# Patient Record
Sex: Female | Born: 1970 | Race: Asian | Hispanic: No | Marital: Married | Smoking: Never smoker
Health system: Southern US, Community
[De-identification: ages and names within clinical notes are randomized; demographics above are authoritative.]

## PROBLEM LIST (undated history)

## (undated) DIAGNOSIS — D649 Anemia, unspecified: Secondary | ICD-10-CM

## (undated) DIAGNOSIS — N841 Polyp of cervix uteri: Secondary | ICD-10-CM

## (undated) HISTORY — DX: Polyp of cervix uteri: N84.1

## (undated) HISTORY — DX: Anemia, unspecified: D64.9

## (undated) HISTORY — PX: CERVICAL POLYPECTOMY: SHX88

---

## 2007-12-01 ENCOUNTER — Ambulatory Visit (HOSPITAL_COMMUNITY): Admission: RE | Admit: 2007-12-01 | Discharge: 2007-12-01 | Payer: Self-pay | Admitting: Obstetrics and Gynecology

## 2007-12-29 ENCOUNTER — Ambulatory Visit (HOSPITAL_COMMUNITY): Admission: RE | Admit: 2007-12-29 | Discharge: 2007-12-29 | Payer: Self-pay | Admitting: Obstetrics and Gynecology

## 2008-01-26 ENCOUNTER — Ambulatory Visit (HOSPITAL_COMMUNITY): Admission: RE | Admit: 2008-01-26 | Discharge: 2008-01-26 | Payer: Self-pay | Admitting: Obstetrics and Gynecology

## 2008-02-09 ENCOUNTER — Inpatient Hospital Stay (HOSPITAL_COMMUNITY): Admission: RE | Admit: 2008-02-09 | Discharge: 2008-02-12 | Payer: Self-pay | Admitting: Obstetrics and Gynecology

## 2008-02-09 ENCOUNTER — Encounter (INDEPENDENT_AMBULATORY_CARE_PROVIDER_SITE_OTHER): Payer: Self-pay | Admitting: Obstetrics and Gynecology

## 2010-08-13 NOTE — Op Note (Signed)
NAMESANDE, PICKERT NO.:  [id number]   MEDICAL RECORD NO.:  [id number]          PATIENT TYPE:  INP   LOCATION:  9302                          FACILITY:  [facility]   PHYSICIAN:  Huel Cote, M.D. DATE OF BIRTH:  May 11, 1970   DATE OF PROCEDURE:  02/09/2008  DATE OF DISCHARGE:                               OPERATIVE REPORT   PREOPERATIVE DIAGNOSES:  1. Term pregnancy at 39 plus weeks.  2. Declines trial of labor.  3. Desires permanent sterilization.  4. Probable fetal gastrointestinal anomaly.   POSTOPERATIVE DIAGNOSES:  1. Term pregnancy at 39 plus weeks.  2. Declines trial of labor.  3. Desires permanent sterilization.  4. Probable fetal gastrointestinal anomaly.  5. Breech presentation.   PROCEDURE:  Repeat low transverse cesarean section with bilateral tubal  ligation.   SURGEON:  Huel Cote, MD   ASSISTANT:  Zenaida Niece, MD   ANESTHESIA:  Spinal.   FINDINGS:  The baby was in the breech presentation, Apgars were 9 at 9,  weight was 2903 g of a viable female infant.   SPECIMEN:  Placenta and bilateral tubal segments were sent to Pathology.   ESTIMATED BLOOD LOSS:  700 mL.   URINE OUTPUT:  200 mL of clear urine.   IV FLUIDS:  2100 mL LR.   COMPLICATIONS:  None.   PROCEDURE:  The patient was taken to the operating room where spinal  anesthesia was obtained without difficulty.  She was then prepped and  draped in the normal sterile fashion in the dorsal supine position with  a leftward tilt.  A Pfannenstiel skin incision was then made with a  scalpel and carried through to the underlying layer of fascia by sharp  dissection, and the fascia nicked in the midline with Bovie.  The  fascial incision was then extended bilaterally with the Mayo scissors  and the inferior aspect grasped with Kocher clamps, elevated, and  dissected off the rectus muscles.  In a similar fashion, the superior  aspect was dissected off the rectus muscles.  The  rectus muscles were  then separated in the midline and the cavity entered bluntly.  The  peritoneal incision was then extended both superiorly and inferiorly  with careful attention to avoid both bowel and bladder.  The Alexis self-  retaining wound retractor was then placed within the incision and the  lower uterine segment exposed nicely.  The lower uterine segment was  then incised in a transverse fashion and the cavity itself entered  bluntly.  The infant was breech presenting and was delivered without  complication with arms and legs reduced easily and the head delivered in  a flexed position.  Nose and mouth were bulb suctioned and the infant  was handing off to the waiting pediatricians for evaluation.  After, the  cord was clamped and cut.  Cord blood was obtained and the placenta was  then delivered spontaneously from the uterus.  The uterus was cleared of  all clots and debris with moist lap sponge.  The uterine incision was  then repaired in a running locked layer of  1-0 chromic.  Good hemostasis  was noted.  Therefore, attention was turned to the patient's fallopian  tubes.  The fallopian tubes were grasped bilaterally with Babcock clamp.  The mesosalpinx had a small window made with the Bovie cautery and this  was then passed through with free tie of 0 plain.  These were tied off  x2 on each side.  A 2-3 cm segment of tube and was completely amputated  without difficulty.  The free pedicles were additionally cauterized with  Bovie cautery.  Good hemostasis was noted.  This was performed  bilaterally with approximately 2-cm segment of tube removed from either  side.  All appeared hemostatic.  The gutters were cleared of all clots  and debris with moist lap sponge.  The remainder of the uterine incision  was once again inspected and any small areas of oozing treated with  Bovie cautery.  The bladder flap was inspected and found to be  hemostatic.  Therefore, the rectus muscles  and peritoneum were  reapproximated with several interrupted sutures of 0 Vicryl in a  mattress suture.  This was performed down the midline and closed the  rectus muscles and peritoneum completely.  The fascia was then closed  with 0 Vicryl in a running fashion and the skin was closed with staples.  Sponge, lap, and needle counts were correct x2 and the patient was taken  to the recovery room in stable condition.      Huel Cote, M.D.  Electronically Signed     KR/MEDQ  D:  02/09/2008  T:  02/10/2008  Job:  [id number]

## 2010-08-13 NOTE — H&P (Signed)
Candace Mccormick, EKSTEIN NO.:  [id number]   MEDICAL RECORD NO.:  [id number]          PATIENT TYPE:  INP   LOCATION:                                [facility]  [facility]   PHYSICIAN:  Huel Cote, M.D. DATE OF BIRTH:  Jul 27, 1970   DATE OF ADMISSION:  02/09/2008  DATE OF DISCHARGE:                              HISTORY & PHYSICAL   She will be coming into [facility] on February 09, 2008, for a  scheduled repeat C-section and a tubal ligation.  The patient is a 40-  year-old G3, P 2-0-0-2, who is coming in as stated for a scheduled  repeat C-section at [redacted] weeks gestation.  She declines trial of labor  given her previous C-sections and also is requesting permanent  sterilization at the time of the procedure.  The patient's prenatal care  has been significant for a probable bowel anomaly on the fetus which has  been followed closely by MFM and serial growth ultrasounds.  There could  be some obstructive process given dilated bowel sounds on the  ultrasound.  Other parts have remained stable and the plan is for  immediate evaluation post C-section with possible transfer to [facility]  if the baby should require any surgical intervention.  The patient  otherwise has had a relatively unremarkable prenatal course.   PRENATAL LABS:  She has prenatal labs as follows:  O+, antibody  negative, RPR nonreactive, rubella immune, hepatitis B surface antigen  negative, HIV negative, GC negative, chlamydia negative, group B strep  was negative, 1-hour Glucola was elevated at 139 and 3-hour Glucola was  normal.  The patient declined any genetic screens in the first or second  trimester, as well as an amnio.   PAST OBSTETRICAL HISTORY:  1. In 1993, she had a vaginal delivery of a female infant with no      complications.  2. In 2001, she had a C-section performed of a 6 pound infant for      arrest of dilation.   PAST MEDICAL HISTORY:  None.   PAST SURGICAL HISTORY:  The C-section  only.   PAST GYN HISTORY:  None.   CURRENT MEDICATIONS:  None.   ALLERGIES:  NONE KNOWN.   PHYSICAL EXAMINATION:  VITAL SIGNS:  On admission, the patient's weight  is 129 pounds, blood pressure 95/60.  CARDIAC:  Regular rate and rhythm.  LUNGS:  Clear.  ABDOMEN:  Gravid and nontender.  Cervix is closed.   The risks and benefits of C-section were discussed with the patient in  detail, including bleeding, infection and possible damage to bowel and  bladder.  This was all performed with a [place] translator as she  speaks very little [place].  We also discussed the fact that she had a  previous vertical incision and a horizontal incision would be possible;  it might would healed faster and this is what the patient prefers.  We  also discussed a tubal sterilization at the patient's request.  Given  that the baby has fetal anomalies, I cautioned her that should the baby  have problems, I would  not wish her to regret any decision for permanent  sterilization and she assures me that she would not.  She has no desire  for future childbearing regardless of the outcome of this child.  We  discussed the risks of failure of approximately 1:100, as well as an  increased risk of ectopic pregnancy should pregnancy occur.  She  understands all of these issues and she desired to proceed with the C-  section and tubal ligation as stated.  The patient will proceed with her  surgery at 11:30 a.m. She has also met with NICU staff who is aware of  the issues with the baby.      Huel Cote, M.D.  Electronically Signed     KR/MEDQ  D:  02/08/2008  T:  02/08/2008  Job:  [id number]

## 2010-08-13 NOTE — Discharge Summary (Signed)
Candace Mccormick, Candace Mccormick               ACCOUNT NO.:  [id number]   MEDICAL RECORD NO.:  [id number]          PATIENT TYPE:  INP   LOCATION:  9302                          [facility]   PHYSICIAN:  Zenaida Niece, M.D.DATE OF BIRTH:  1970/04/22   DATE OF ADMISSION:  02/09/2008  DATE OF DISCHARGE:  02/12/2008                               DISCHARGE SUMMARY   ADMISSION DIAGNOSES:  Intrauterine pregnancy at 39 weeks, previous  cesarean section, desires permanent sterility, and probable fetal  gastrointestinal anomaly.   DISCHARGE DIAGNOSES:  Intrauterine pregnancy at 39 weeks, previous  cesarean section, desires permanent sterility, and probable fetal  gastrointestinal anomaly, breech presentation, and abdominal wall  hematoma.   PROCEDURE:  On February 09, 2008, she had a repeat low-transverse  cesarean section and bilateral tubal ligation.   HISTORY AND PHYSICAL:  Please see chart for full history and physical.  Briefly, this is a 40 year old gravida 3, para 2-0-0-2 with an EGA of [redacted]  weeks was admitted for repeat cesarean section.  Prenatal care is  significant for a probable bowel anomaly closely followed by Maternal  Fetal Medicine consultation and ultrasounds.  Again, prenatal labs are  in her history and physical.   PAST OB HISTORY:  Significant for a 1993 vaginal delivery and in 2001  cesarean section for a 6-pound infant with arrest of dilation.  The  remainder of her history is only significant for the cesarean section.   PHYSICAL EXAMINATION:  She is gravid and cervix is closed.   HOSPITAL COURSE:  The patient was admitted and taken to the operating  room where she had a repeat cesarean section done under spinal  anesthesia.  The baby turned out to be in the breech presentation and  was a viable female with Apgars of 9 and 9, and weight 2903 g.  Estimated blood loss was 700 mL.  Postoperatively, she had no  significant complications except for mild urinary retention  which  resolved on its own.  Preoperative hemoglobin was 12.3, postoperative is  10.8.  Baby apparently did well.  On the morning of postoperative #3,  the patient was stable and was felt to be stable enough for discharge  home.  She was noted at that point to have an approximately 4 x 5 cm  abdominal wall hematoma between the umbilicus and her incision.  Her  incision appeared to be healing well.  Prior to discharge, the nurse is  to remove her staples and apply Steri-Strips.   DISCHARGE INSTRUCTIONS:  Regular diet, pelvic rest, no strenuous  activity.  Followup is in 2 weeks for an incision check.   MEDICATIONS:  Over-the-counter ibuprofen as needed and she is given our  discharge pamphlet.     Zenaida Niece, M.D.  Electronically Signed    TDM/MEDQ  D:  02/12/2008  T:  02/12/2008  Job:  [id number]

## 2010-12-31 LAB — CBC
HCT: 36.9
Hemoglobin: 10.8 — ABNORMAL LOW
Hemoglobin: 12.3
MCHC: 33.2
MCV: 107.2 — ABNORMAL HIGH
Platelets: 269
RBC: 3.47 — ABNORMAL LOW
RDW: 13.6
RDW: 13.8
WBC: 10.5
WBC: 15.2 — ABNORMAL HIGH

## 2010-12-31 LAB — TYPE AND SCREEN

## 2010-12-31 LAB — RPR: RPR Ser Ql: NONREACTIVE

## 2014-01-23 ENCOUNTER — Emergency Department (HOSPITAL_COMMUNITY): Payer: 59

## 2014-01-23 ENCOUNTER — Ambulatory Visit (INDEPENDENT_AMBULATORY_CARE_PROVIDER_SITE_OTHER): Payer: 59

## 2014-01-23 ENCOUNTER — Inpatient Hospital Stay (HOSPITAL_COMMUNITY)
Admission: EM | Admit: 2014-01-23 | Discharge: 2014-01-24 | DRG: 812 | Disposition: A | Discharge status: Home / Self Care | Payer: 59 | Attending: Internal Medicine | Admitting: Internal Medicine

## 2014-01-23 ENCOUNTER — Ambulatory Visit (INDEPENDENT_AMBULATORY_CARE_PROVIDER_SITE_OTHER): Payer: 59 | Admitting: Family Medicine

## 2014-01-23 ENCOUNTER — Encounter (HOSPITAL_COMMUNITY): Payer: Self-pay | Admitting: Emergency Medicine

## 2014-01-23 VITALS — BP 110/60 | HR 72 | Temp 97.9°F | Resp 16 | Ht 58.5 in | Wt 106.0 lb

## 2014-01-23 DIAGNOSIS — M545 Low back pain, unspecified: Secondary | ICD-10-CM

## 2014-01-23 DIAGNOSIS — N889 Noninflammatory disorder of cervix uteri, unspecified: Secondary | ICD-10-CM

## 2014-01-23 DIAGNOSIS — R42 Dizziness and giddiness: Secondary | ICD-10-CM | POA: Diagnosis not present

## 2014-01-23 DIAGNOSIS — D649 Anemia, unspecified: Principal | ICD-10-CM | POA: Diagnosis present

## 2014-01-23 DIAGNOSIS — Z23 Encounter for immunization: Secondary | ICD-10-CM

## 2014-01-23 DIAGNOSIS — R8290 Unspecified abnormal findings in urine: Secondary | ICD-10-CM

## 2014-01-23 DIAGNOSIS — M25559 Pain in unspecified hip: Secondary | ICD-10-CM

## 2014-01-23 DIAGNOSIS — R634 Abnormal weight loss: Secondary | ICD-10-CM | POA: Diagnosis present

## 2014-01-23 DIAGNOSIS — Z6822 Body mass index (BMI) 22.0-22.9, adult: Secondary | ICD-10-CM

## 2014-01-23 DIAGNOSIS — N930 Postcoital and contact bleeding: Secondary | ICD-10-CM | POA: Diagnosis present

## 2014-01-23 DIAGNOSIS — N939 Abnormal uterine and vaginal bleeding, unspecified: Secondary | ICD-10-CM

## 2014-01-23 DIAGNOSIS — Z13220 Encounter for screening for lipoid disorders: Secondary | ICD-10-CM

## 2014-01-23 DIAGNOSIS — Z124 Encounter for screening for malignant neoplasm of cervix: Secondary | ICD-10-CM

## 2014-01-23 DIAGNOSIS — Z1329 Encounter for screening for other suspected endocrine disorder: Secondary | ICD-10-CM

## 2014-01-23 DIAGNOSIS — N841 Polyp of cervix uteri: Secondary | ICD-10-CM

## 2014-01-23 DIAGNOSIS — Z79899 Other long term (current) drug therapy: Secondary | ICD-10-CM

## 2014-01-23 DIAGNOSIS — Z0000 Encounter for general adult medical examination without abnormal findings: Secondary | ICD-10-CM

## 2014-01-23 DIAGNOSIS — R350 Frequency of micturition: Secondary | ICD-10-CM

## 2014-01-23 LAB — CBC
HCT: 20.5 % — ABNORMAL LOW (ref 36.0–46.0)
HEMOGLOBIN: 5 g/dL — AB (ref 12.0–15.0)
MCH: 16 pg — AB (ref 26.0–34.0)
MCHC: 24.4 g/dL — AB (ref 30.0–36.0)
MCV: 65.7 fL — ABNORMAL LOW (ref 78.0–100.0)
PLATELETS: 433 10*3/uL — AB (ref 150–400)
RBC: 3.12 MIL/uL — AB (ref 3.87–5.11)
RDW: 20.6 % — AB (ref 11.5–15.5)
WBC: 6.4 10*3/uL (ref 4.0–10.5)

## 2014-01-23 LAB — COMPREHENSIVE METABOLIC PANEL
ALK PHOS: 64 U/L (ref 39–117)
ALT: 13 U/L (ref 0–35)
ANION GAP: 13 (ref 5–15)
AST: 21 U/L (ref 0–37)
Albumin: 3.7 g/dL (ref 3.5–5.2)
BILIRUBIN TOTAL: 0.3 mg/dL (ref 0.3–1.2)
BUN: 6 mg/dL (ref 6–23)
CALCIUM: 9.1 mg/dL (ref 8.4–10.5)
CHLORIDE: 106 meq/L (ref 96–112)
CO2: 22 meq/L (ref 19–32)
CREATININE: 0.46 mg/dL — AB (ref 0.50–1.10)
GFR calc Af Amer: >90 mL/min (ref >90)
GFR calc non Af Amer: >90 mL/min (ref >90)
GLUCOSE: 135 mg/dL — AB (ref 70–99)
Potassium: 4.5 mEq/L (ref 3.7–5.3)
SODIUM: 141 meq/L (ref 137–147)
Total Protein: 7.5 g/dL (ref 6.0–8.3)

## 2014-01-23 LAB — POCT URINALYSIS DIPSTICK
Bilirubin, UA: NEGATIVE
Glucose, UA: NEGATIVE
Ketones, UA: NEGATIVE
Nitrite, UA: NEGATIVE
Protein, UA: NEGATIVE
Spec Grav, UA: 1.01
Urobilinogen, UA: 0.2
pH, UA: 7

## 2014-01-23 LAB — POCT CBC
Granulocyte percent: 38.3 %G (ref 37–80)
HCT, POC: 18.2 % — AB (ref 37.7–47.9)
Hemoglobin: 4.7 g/dL — AB (ref 12.2–16.2)
Lymph, poc: 3 (ref 0.6–3.4)
MCH, POC: 16 pg — AB (ref 27–31.2)
MCHC: 25.8 g/dL — AB (ref 31.8–35.4)
MCV: 61.9 fL — AB (ref 80–97)
MID (cbc): 0.4 (ref 0–0.9)
MPV: 6.8 fL (ref 0–99.8)
POC Granulocyte: 2.1 (ref 2–6.9)
POC LYMPH PERCENT: 54.6 %L — AB (ref 10–50)
POC MID %: 7.1 %M (ref 0–12)
Platelet Count, POC: 418 10*3/uL (ref 142–424)
RBC: 2.95 M/uL — AB (ref 4.04–5.48)
RDW, POC: 21.6 %
WBC: 5.5 10*3/uL (ref 4.6–10.2)

## 2014-01-23 LAB — I-STAT BETA HCG BLOOD, ED (MC, WL, AP ONLY)

## 2014-01-23 LAB — ABO/RH: ABO/RH(D): O POS

## 2014-01-23 LAB — FERRITIN: FERRITIN: 2 ng/mL — AB (ref 10–291)

## 2014-01-23 LAB — POC OCCULT BLOOD, ED: FECAL OCCULT BLD: NEGATIVE

## 2014-01-23 LAB — RETICULOCYTES
RBC.: 3.15 MIL/uL — AB (ref 3.87–5.11)
RETIC COUNT ABSOLUTE: 41 10*3/uL (ref 19.0–186.0)
Retic Ct Pct: 1.3 % (ref 0.4–3.1)

## 2014-01-23 LAB — IRON AND TIBC
Iron: <10 ug/dL — ABNORMAL LOW (ref 42–135)
UIBC: 524 ug/dL — AB (ref 125–400)

## 2014-01-23 LAB — POCT UA - MICROSCOPIC ONLY
Casts, Ur, LPF, POC: NEGATIVE
Crystals, Ur, HPF, POC: NEGATIVE
Mucus, UA: NEGATIVE
Yeast, UA: NEGATIVE

## 2014-01-23 LAB — VITAMIN B12: VITAMIN B 12: 1088 pg/mL — AB (ref 211–911)

## 2014-01-23 LAB — FOLATE: Folate: 10.5 ng/mL

## 2014-01-23 LAB — PREPARE RBC (CROSSMATCH)

## 2014-01-23 MED ORDER — ONDANSETRON HCL 4 MG/2ML IJ SOLN: 4.0000 mg | Freq: Four times a day (QID) | INTRAMUSCULAR | Status: DC | PRN

## 2014-01-23 MED ORDER — ONDANSETRON HCL 4 MG PO TABS: 4.0000 mg | ORAL_TABLET | Freq: Four times a day (QID) | ORAL | Status: DC | PRN

## 2014-01-23 MED ORDER — SODIUM CHLORIDE 0.9 % IV SOLN
10.0000 mL/h | Freq: Once | INTRAVENOUS | Status: AC
  Administered 2014-01-23: 10 mL/h via INTRAVENOUS

## 2014-01-23 MED ORDER — ACETAMINOPHEN 650 MG RE SUPP: 650.0000 mg | Freq: Four times a day (QID) | RECTAL | Status: DC | PRN

## 2014-01-23 MED ORDER — SODIUM CHLORIDE 0.9 % IV SOLN
INTRAVENOUS | Status: AC
  Administered 2014-01-24: 02:00:00 via INTRAVENOUS

## 2014-01-23 MED ORDER — ACETAMINOPHEN 325 MG PO TABS
650.0000 mg | ORAL_TABLET | Freq: Four times a day (QID) | ORAL | Status: DC | PRN
  Administered 2014-01-24: 650 mg via ORAL
  Filled 2014-01-23: qty 2

## 2014-01-23 NOTE — Progress Notes (Addendum)
Chief Complaint:  Chief Complaint  Patient presents with  . Annual Exam    CPE with Dr. Marin Comment    HPI: Candace Mccormick is a 43 y.o. female who is here for  Annual exam She has G3L3 via c section ( failure to progress for the first one and so she did that for all others) She has had TBL She desires a flu vaccine She has ahd urianry incontinence for 1 year and also has ahd some bloody discharge, bilateral low back pain x 2 weeks LMP was 12/29/13 She states she has vaginal dc it is described as urine like, no odor not necessarily blood about 2 weeks before she is completely finished with her period, she has some clots but nothing major or for very long. She has to wear a pad due to this urinary incontinence Menarche 13 years, every 30 days regular, normal flow She takes ibuprofen 600 mg daily x 1 for HA and joint pain She has had a hx of anemia, last HGb we have on file was 10.8 when she was pregnant  Intermittent dizziness, no hematuria or melena  She has bleeding after sex, she has a "thing she thinks is a blood vessel in her vagina which she has had to push back before" it bleeds after sex but is not painful  No fevers, chills, n.v.abd , unintentional weightloss, chills, night sweats Recently moved here from [place], had ? Pap there She is married and no prior hx of STD She denies CP, SOB, palpitations, she has intermittent HA, back pain and dizziness.   No family hx of uterine, breast or cervical cancer   History reviewed. No pertinent past medical history. History reviewed. No pertinent past surgical history. History   Social History  . Marital Status: Married    Spouse Name: N/A    Number of Children: N/A  . Years of Education: N/A   Social History Main Topics  . Smoking status: Never Smoker   . Smokeless tobacco: None  . Alcohol Use: No  . Drug Use: No  . Sexual Activity: None   Other Topics Concern  . None   Social History Narrative  . None   History  reviewed. No pertinent family history. No Known Allergies Prior to Admission medications   Not on File     ROS: The patient denies fevers, chills, night sweats, unintentional weight loss, chest pain, palpitations, wheezing, dyspnea on exertion, nausea, vomiting, abdominal pain, dysuria, hematuria, melena, numbness, weakness, or tingling. + intermittent dizziness but not much  All other systems have been reviewed and were otherwise negative with the exception of those mentioned in the HPI and as above.    PHYSICAL EXAM: Filed Vitals:   01/23/14 1127  BP: 110/60  Pulse: 72  Temp: 97.9 F (36.6 C)  Resp: 16   Filed Vitals:   01/23/14 1127  Height: 4' 10.5" (1.486 m)  Weight: 106 lb (48.081 kg)   Body mass index is 21.77 kg/(m^2).  General: Alert, no acute distress HEENT:  Normocephalic, atraumatic, oropharynx patent. EOMI, PERRLA, tm nl, no exudates, no thyroidmegaly Cardiovascular:  Regular rate and rhythm, no rubs murmurs or gallops.  No Carotid bruits, radial pulse intact. No pedal edema.  Respiratory: Clear to auscultation bilaterally.  No wheezes, rales, or rhonchi.  No cyanosis, no use of accessory musculature GI: No organomegaly, abdomen is soft and non-tender, positive bowel sounds.  No masses. Skin: No rashes. Neurologic: Facial musculature symmetric. Psychiatric: Patient is  appropriate throughout our interaction. Lymphatic: No cervical lymphadenopathy Musculoskeletal: Gait intact. 5/5 strength, 2/2 DTRs, + LBP tenderness on palpation, righ side, full ROM Breast exam normal Cervix-there is a long mass/polyp like lesion in the cervical os, minimal bleeding.  No odor, no other masses   LABS: Results for orders placed in visit on 01/23/14  POCT CBC      Result Value Ref Range   WBC 5.5  4.6 - 10.2 K/uL   Lymph, poc 3.0  0.6 - 3.4   POC LYMPH PERCENT 54.6 (*) 10 - 50 %L   MID (cbc) 0.4  0 - 0.9   POC MID % 7.1  0 - 12 %M   POC Granulocyte 2.1  2 - 6.9    Granulocyte percent 38.3  37 - 80 %G   RBC 2.95 (*) 4.04 - 5.48 M/uL   Hemoglobin 4.7 (*) 12.2 - 16.2 g/dL   HCT, POC 18.2 (*) 37.7 - 47.9 %   MCV 61.9 (*) 80 - 97 fL   MCH, POC 16.0 (*) 27 - 31.2 pg   MCHC 25.8 (*) 31.8 - 35.4 g/dL   RDW, POC 21.6     Platelet Count, POC 418  142 - 424 K/uL   MPV 6.8  0 - 99.8 fL  POCT UA - MICROSCOPIC ONLY      Result Value Ref Range   WBC, Ur, HPF, POC 0-4     RBC, urine, microscopic 0-1     Bacteria, U Microscopic trace     Mucus, UA neg     Epithelial cells, urine per micros 1-4     Crystals, Ur, HPF, POC neg     Casts, Ur, LPF, POC neg     Yeast, UA neg    POCT URINALYSIS DIPSTICK      Result Value Ref Range   Color, UA yellow     Clarity, UA clear     Glucose, UA neg     Bilirubin, UA neg     Ketones, UA neg     Spec Grav, UA 1.010     Blood, UA small     pH, UA 7.0     Protein, UA neg     Urobilinogen, UA 0.2     Nitrite, UA neg     Leukocytes, UA Trace       EKG/XRAY:   Primary read interpreted by Dr. Marin Comment at [facility]. No obvious masses/lesion, obstruction, free air   ASSESSMENT/PLAN: Encounter Diagnoses  Name Primary?  . Annual physical exam Yes  . Screening for cervical cancer   . Screening for hyperlipidemia   . Screening for thyroid disorder   . Flu vaccine need   . Increased urinary frequency   . Right-sided low back pain without sciatica   . Cervical polyp   . Pain in joint, pelvic region and thigh, unspecified laterality   . Abnormal urine   . Anemia, unspecified anemia type    43 y/o [place] female who is here for an annual visit with PAP She is found to have an abnormal Hgb of 4.7 , very minimally symptomatic with intermittent dizziness, denies CP, SOB, papitations, does take daily Ibuprofen 600 mg daily. We ran the repeat CBC x 3 with same results.  Cervical exam was abnormal with a cervical polyp/mass like lesion that has been there for about 1 year, she states she went to a doctor in [place] and was  evaluated abotu 5 months ago and had no concerns, no  one contacted her about anything Annual labs pending also anemia iron studies pending: CMP, lipid, TSH, iron, pap 3 and urine cx pending Refer to ob/gyn She was referred to [facility] ED for further evalaution of anemia  Gross sideeffects, risk and benefits, and alternatives of medications d/w patient. Patient is aware that all medications have potential sideeffects and we are unable to predict every sideeffect or drug-drug interaction that may occur.  Ryllie Nieland, [place], DO 01/23/2014 1:51 PM

## 2014-01-23 NOTE — ED Notes (Signed)
US at bedside

## 2014-01-23 NOTE — H&P (Signed)
[facility] History and Physical  Candace Mccormick JKD:[id number] DOB: 09-01-1970 DOA: 01/23/2014  Referring physician: er PCP: No PCP Per Patient   Chief Complaint: sent from urgent care  HPI: Candace Mccormick is a 43 y.o. female  Sent from PCP with low Hgb.  Patient has been symptomatic- fatigue, dizziness, SOB. At her PCP with Dr. Marin Comment, patient had full exam including pelvic exam on which it was noted there was irregularity on pt's cervix. Pt states that she was evaluated for this in [place] and was told it was nothing emergent.Pt deneis any blood in her stool. States she has periods that last approximately 7 days, heavy- required 5 + pads in the first 4 days. Reports bleeding after intercourse.  In the ER, her Hgb was confirmed low and a U/S was done: Abnormal hypoechoic soft tissue extends from the posterior margin  of the lower endometrium through the endocervix to protrudes from the external cervical os. This soft tissue is vascular. Was could reflect a pedunculated polyp. Consider further evaluation with sonohysterogram for confirmation prior to hysteroscopy. Endometrial sampling should also be considered if patient is at high risk for endometrial carcinoma. ER PA spoke with Dr. Roland Rack who said patient could follow up in her officeas there is nothing to be done now as patient is not actively bleeding   Review of Systems:  All systems reviewed, negative unless stated above.   History reviewed. No pertinent past medical history. No past surgical history on file. Social History:  reports that she has never smoked. She does not have any smokeless tobacco history on file. She reports that she does not drink alcohol or use illicit drugs.  No Known Allergies  No family history on file.   Prior to Admission medications   Medication Sig Start Date End Date Taking? Authorizing Provider  ibuprofen (ADVIL,MOTRIN) 200 MG tablet Take 200 mg by mouth 3 (three) times daily.   Yes  Historical Provider, MD  Omega-3 Fatty Acids (FISH OIL PO) Take 1 tablet by mouth every evening.   Yes Historical Provider, MD   Physical Exam: Filed Vitals:   01/23/14 1631 01/23/14 1855 01/23/14 1915 01/23/14 1927  BP: 104/55 104/47 111/53 103/50  Pulse: 64 59 51 58  Temp: 98.5 F (36.9 C) 98.2 F (36.8 C) 98.2 F (36.8 C)   TempSrc: Oral Oral Oral   Resp: 12 16 16 14   SpO2:  100% 100% 100%    Wt Readings from Last 3 Encounters:  01/23/14 48.081 kg (106 lb)    General:  Appears calm and comfortable Eyes: PERRL, normal lids, irises & conjunctiva ENT: grossly normal hearing, lips & tongue Neck: no LAD, masses or thyromegaly Cardiovascular: RRR, no m/r/g. No LE edema. Telemetry: SR, no arrhythmias  Respiratory: CTA bilaterally, no w/r/r. Normal respiratory effort. Abdomen: soft, ntnd Skin: no rash or induration seen on limited exam Musculoskeletal: grossly normal tone BUE/BLE Psychiatric: grossly normal mood and affect, speech fluent and appropriate Neurologic: grossly non-focal.          Labs on Admission:  Basic Metabolic Panel:  Recent Labs Lab 01/23/14 1446  NA 141  K 4.5  CL 106  CO2 22  GLUCOSE 135*  BUN 6  CREATININE 0.46*  CALCIUM 9.1   Liver Function Tests:  Recent Labs Lab 01/23/14 1446  AST 21  ALT 13  ALKPHOS 64  BILITOT 0.3  PROT 7.5  ALBUMIN 3.7   No results found for this basename: LIPASE, AMYLASE,  in the last 168 hours  No results found for this basename: AMMONIA,  in the last 168 hours CBC:  Recent Labs Lab 01/23/14 1219 01/23/14 1446  WBC 5.5 6.4  HGB 4.7* 5.0*  HCT 18.2* 20.5*  MCV 61.9* 65.7*  PLT  --  433*   Cardiac Enzymes: No results found for this basename: CKTOTAL, CKMB, CKMBINDEX, TROPONINI,  in the last 168 hours  BNP (last 3 results) No results found for this basename: PROBNP,  in the last 8760 hours CBG: No results found for this basename: GLUCAP,  in the last 168 hours  Radiological Exams on  Admission: Dg Pelvis 1-2 Views  01/23/2014   CLINICAL DATA:  Right sided low back pain without sciatica. Pain in joint, pelvic region and thigh, unspecified laterality.  EXAM: PELVIS - 1-2 VIEW  COMPARISON:  None.  FINDINGS: Hips are symmetric. No joint space narrowing or degenerative change. Obturator rings are intact. Sacroiliac joints are symmetric.  IMPRESSION: Negative.   Electronically Signed   By: Lorin Picket M.D.   On: 01/23/2014 14:29   Dg Abd 1 View  01/23/2014   CLINICAL DATA:  Right-sided low back pain without sciatica M54.5 (ICD-10-CM) Pain in joint, pelvic region and thigh, unspecified laterality M25.559 (ICD-10-CM). low back pain  EXAM: ABDOMEN - 1 VIEW  COMPARISON:  Pelvic films of same date  FINDINGS: Minimal convex left lumbar spine curvature. Nonobstructive bowel gas pattern. No abnormal abdominal calcifications. No appendicolith. Minimal motion degradation. Minimal distal gas.  IMPRESSION: No acute findings.   Electronically Signed   By: Abigail Miyamoto M.D.   On: 01/23/2014 14:29   US Transvaginal Non-ob  01/23/2014   CLINICAL DATA:  Cervix abnormality on current physical exam. Vaginal discharge. Vaginal bleeding. Low hemoglobin.  EXAM: TRANSABDOMINAL AND TRANSVAGINAL ULTRASOUND OF PELVIS  TECHNIQUE: Both transabdominal and transvaginal ultrasound examinations of the pelvis were performed. Transabdominal technique was performed for global imaging of the pelvis including uterus, ovaries, adnexal regions, and pelvic cul-de-sac. It was necessary to proceed with endovaginal exam following the transabdominal exam to visualize the uterus, endometrium and ovaries to better advantage.  COMPARISON:  None  FINDINGS: Uterus  Measurements: 10.9 cm x 4.9 cm x 5.6 cm. Heterogeneous echogenicity. 2 cm right fundal subserosal fibroid. No other uterine masses.  Endometrium  Thickness: 6.3 mm. Area of vascularized soft tissue, hypoechoic, protrudes from the posterior aspect of the endometrium through  the cervical canal to the external cervical os. This measures 7.2 mm in thickness at the level of the cervical canal.  Right ovary  Measurements: 2.1 cm x 1.3 cm x 1.6 cm. Normal appearance/no adnexal mass.  Left ovary  Measurements: 4.1 cm x 1.7 cm x 2.5 cm. Two adjacent ovarian cysts, largest measuring 19 mm, presumed to be physiologic cysts given this patient's age. Ovary otherwise unremarkable. No adnexal mass.  Other findings  Trace, likely physiologic, free fluid.  IMPRESSION: 1. Abnormal hypoechoic soft tissue extends from the posterior margin of the lower endometrium through the endocervix to protrudes from the external cervical os. This soft tissue is vascular. Was could reflect a pedunculated polyp. Consider further evaluation with sonohysterogram for confirmation prior to hysteroscopy. Endometrial sampling should also be considered if patient is at high risk for endometrial carcinoma. (Ref: Radiological Reasoning: Algorithmic Workup of Abnormal Vaginal Bleeding with Endovaginal Sonography and Sonohysterography. AJR 2008; 756:E33-29) 2. Small right uterine fundal sub serosal fibroid. 3. No other abnormalities.   Electronically Signed   By: Lajean Manes M.D.   On: 01/23/2014 19:04  US Pelvis Complete  01/23/2014   CLINICAL DATA:  Cervix abnormality on current physical exam. Vaginal discharge. Vaginal bleeding. Low hemoglobin.  EXAM: TRANSABDOMINAL AND TRANSVAGINAL ULTRASOUND OF PELVIS  TECHNIQUE: Both transabdominal and transvaginal ultrasound examinations of the pelvis were performed. Transabdominal technique was performed for global imaging of the pelvis including uterus, ovaries, adnexal regions, and pelvic cul-de-sac. It was necessary to proceed with endovaginal exam following the transabdominal exam to visualize the uterus, endometrium and ovaries to better advantage.  COMPARISON:  None  FINDINGS: Uterus  Measurements: 10.9 cm x 4.9 cm x 5.6 cm. Heterogeneous echogenicity. 2 cm right fundal  subserosal fibroid. No other uterine masses.  Endometrium  Thickness: 6.3 mm. Area of vascularized soft tissue, hypoechoic, protrudes from the posterior aspect of the endometrium through the cervical canal to the external cervical os. This measures 7.2 mm in thickness at the level of the cervical canal.  Right ovary  Measurements: 2.1 cm x 1.3 cm x 1.6 cm. Normal appearance/no adnexal mass.  Left ovary  Measurements: 4.1 cm x 1.7 cm x 2.5 cm. Two adjacent ovarian cysts, largest measuring 19 mm, presumed to be physiologic cysts given this patient's age. Ovary otherwise unremarkable. No adnexal mass.  Other findings  Trace, likely physiologic, free fluid.  IMPRESSION: 1. Abnormal hypoechoic soft tissue extends from the posterior margin of the lower endometrium through the endocervix to protrudes from the external cervical os. This soft tissue is vascular. Was could reflect a pedunculated polyp. Consider further evaluation with sonohysterogram for confirmation prior to hysteroscopy. Endometrial sampling should also be considered if patient is at high risk for endometrial carcinoma. (Ref: Radiological Reasoning: Algorithmic Workup of Abnormal Vaginal Bleeding with Endovaginal Sonography and Sonohysterography. AJR 2008; 546:T03-54) 2. Small right uterine fundal sub serosal fibroid. 3. No other abnormalities.   Electronically Signed   By: Lajean Manes M.D.   On: 01/23/2014 19:04      Assessment/Plan Active Problems:   Anemia   Vaginal bleeding   Vaginal bleeding- has seen a GYN in [place] and had procedure done- not sure what kind.  ER spoke with Dr. Roland Rack here who will see patient in follow up as no active bleeding  Anemia- ABLA- give 2 units now and recheck- unfortunately, no Fe studies done- will check after transfusions but not sure how helpful it will be   Code Status: full DVT Prophylaxis: Family Communication: patient via interpreter Disposition Plan: obs  Time spent: 65 min- spoke via  pacific interpreters  Eliseo Squires [facility] Pager [phone]

## 2014-01-23 NOTE — ED Notes (Signed)
Pt, being sent by PCP, c/o slight dizziness.  Lab work resulted Hgb 4.7.

## 2014-01-23 NOTE — ED Notes (Signed)
Pt sent by PCP for hgb of 4.7.  States she is having all over body pain.  Pt ambulatory w/o assistance to triage chair.  NAD.

## 2014-01-23 NOTE — ED Provider Notes (Signed)
Medical screening examination/treatment/procedure(s) were performed by non-physician practitioner and as supervising physician I was immediately available for consultation/collaboration.   EKG Interpretation None        Orpah Greek, MD 01/23/14 2120

## 2014-01-23 NOTE — ED Provider Notes (Signed)
CSN: [id number]     Arrival date & time 01/23/14  1355 History   First MD Initiated Contact with Patient 01/23/14 1506     Chief Complaint  Patient presents with  . Anemia     (Consider location/radiation/quality/duration/timing/severity/associated sxs/prior Treatment) HPI Candace Mccormick is a 43 y.o. female who presents to ED with complaint of anemia. PT is vietnamese speaking only, translator phone used. Pt went to [facility] today for rutine physical. While there, CBC was obtained and showed hgb of 4.7. Pt sent here for further evalation. Pt did have full exam including pelvic exam on which it was noted there was irregularity on pt's cervix. Pt states that she was evaluated for this in [place] where she is from, and was told it was nothing emergent. Pt now moved to [place]. Pt deneis any blood in her stool. States she has periods that last approximately 7 days, heavy. Reports bleeding after intercourse. Denies blood in her urine. Pt denies any abdominal pain. States at times has headache and some dizziness. No other complaints.   History reviewed. No pertinent past medical history. No past surgical history on file. No family history on file. History  Substance Use Topics  . Smoking status: Never Smoker   . Smokeless tobacco: Not on file  . Alcohol Use: No   OB History   Grav Para Term Preterm Abortions TAB SAB Ect Mult Living                 Review of Systems  Constitutional: Negative for fever and chills.  Respiratory: Negative for cough, chest tightness and shortness of breath.   Cardiovascular: Negative for chest pain, palpitations and leg swelling.  Gastrointestinal: Negative for nausea, vomiting, abdominal pain and diarrhea.  Genitourinary: Positive for vaginal bleeding. Negative for dysuria, flank pain, vaginal discharge, vaginal pain and pelvic pain.  Musculoskeletal: Positive for back pain. Negative for arthralgias, myalgias, neck pain and neck stiffness.  Skin:  Positive for pallor. Negative for rash.  Neurological: Positive for dizziness and headaches. Negative for weakness.  All other systems reviewed and are negative.     Allergies  Review of patient's allergies indicates no known allergies.  Home Medications   Prior to Admission medications   Not on File   BP 108/50  Pulse 59  Temp(Src) 97.4 F (36.3 C) (Oral)  Resp 18  SpO2 100% Physical Exam  Nursing note and vitals reviewed. Constitutional: She is oriented to person, place, and time. She appears well-developed and well-nourished. No distress.  HENT:  Head: Normocephalic.  Mouth/Throat: No oropharyngeal exudate.  Eyes: Conjunctivae and EOM are normal. Pupils are equal, round, and reactive to light. No scleral icterus.  Neck: Neck supple.  Cardiovascular: Normal rate, regular rhythm and normal heart sounds.   Pulmonary/Chest: Effort normal and breath sounds normal. No respiratory distress. She has no wheezes. She has no rales.  Abdominal: Soft. Bowel sounds are normal. She exhibits no distension. There is no tenderness. There is no rebound.  Musculoskeletal: She exhibits no edema.  Neurological: She is alert and oriented to person, place, and time.  Skin: Skin is warm and dry. There is pallor.    ED Course  Procedures (including critical care time) Labs Review Labs Reviewed  CBC - Abnormal; Notable for the following:    RBC 3.12 (*)    Hemoglobin 5.0 (*)    HCT 20.5 (*)    MCV 65.7 (*)    MCH 16.0 (*)    MCHC 24.4 (*)  RDW 20.6 (*)    Platelets 433 (*)    All other components within normal limits  COMPREHENSIVE METABOLIC PANEL - Abnormal; Notable for the following:    Glucose, Bld 135 (*)    Creatinine, Ser 0.46 (*)    All other components within normal limits  VITAMIN B12 - Abnormal; Notable for the following:    Vitamin B-12 1088 (*)    All other components within normal limits  FERRITIN - Abnormal; Notable for the following:    Ferritin 2 (*)    All other  components within normal limits  RETICULOCYTES - Abnormal; Notable for the following:    RBC. 3.15 (*)    All other components within normal limits  FOLATE  IRON AND TIBC  I-STAT BETA HCG BLOOD, ED ([facility], [facility], [facility] ONLY)  POC OCCULT BLOOD, ED  TYPE AND SCREEN  PREPARE RBC (CROSSMATCH)  ABO/RH    Imaging Review Dg Pelvis 1-2 Views  01/23/2014   CLINICAL DATA:  Right sided low back pain without sciatica. Pain in joint, pelvic region and thigh, unspecified laterality.  EXAM: PELVIS - 1-2 VIEW  COMPARISON:  None.  FINDINGS: Hips are symmetric. No joint space narrowing or degenerative change. Obturator rings are intact. Sacroiliac joints are symmetric.  IMPRESSION: Negative.   Electronically Signed   By: Lorin Picket M.D.   On: 01/23/2014 14:29   Dg Abd 1 View  01/23/2014   CLINICAL DATA:  Right-sided low back pain without sciatica M54.5 (ICD-10-CM) Pain in joint, pelvic region and thigh, unspecified laterality M25.559 (ICD-10-CM). low back pain  EXAM: ABDOMEN - 1 VIEW  COMPARISON:  Pelvic films of same date  FINDINGS: Minimal convex left lumbar spine curvature. Nonobstructive bowel gas pattern. No abnormal abdominal calcifications. No appendicolith. Minimal motion degradation. Minimal distal gas.  IMPRESSION: No acute findings.   Electronically Signed   By: Abigail Miyamoto M.D.   On: 01/23/2014 14:29     EKG Interpretation None      MDM   Final diagnoses:  Cervix abnormality  Anemia, unspecified anemia type    Patient is here from urgent care with hemoglobin of 4.7. She denies any blood per rectum. She does have intermittent vaginal bleeding after intercourse, heavy periods. No current active bleeding. Patient is pale on exam, no other complaints at this time. Anemia panel is ordered. Will transfuse 2 units of blood. Vital signs are normal will monitor.   8:48 PM Discussed with OB/GYN, will need treatment on outpatient basis. At this time Patient procedure needed. Patient will need  blood transfusion. Since she started on blood transfusions here [facility], we will keep her here. She will be [facility] in [facility] clinic at [facility]. If she does not hear from them in few days, she should call [phone].   Spoke with [facility], will admit.   Renold Genta, PA-C 01/23/14 2049

## 2014-01-23 NOTE — ED Notes (Signed)
Report to [place], RN on 3W.

## 2014-01-23 NOTE — Patient Instructions (Signed)
Anemia, Nonspecific Anemia is a condition in which the concentration of red blood cells or hemoglobin in the blood is below normal. Hemoglobin is a substance in red blood cells that carries oxygen to the tissues of the body. Anemia results in not enough oxygen reaching these tissues.  CAUSES  Common causes of anemia include:   Excessive bleeding. Bleeding may be internal or external. This includes excessive bleeding from periods (in women) or from the intestine.   Poor nutrition.   Chronic kidney, thyroid, and liver disease.  Bone marrow disorders that decrease red blood cell production.  Cancer and treatments for cancer.  HIV, AIDS, and their treatments.  Spleen problems that increase red blood cell destruction.  Blood disorders.  Excess destruction of red blood cells due to infection, medicines, and autoimmune disorders. SIGNS AND SYMPTOMS   Minor weakness.   Dizziness.   Headache.  Palpitations.   Shortness of breath, especially with exercise.   Paleness.  Cold sensitivity.  Indigestion.  Nausea.  Difficulty sleeping.  Difficulty concentrating. Symptoms may occur suddenly or they may develop slowly.  DIAGNOSIS  Additional blood tests are often needed. These help your health care provider determine the best treatment. Your health care provider will check your stool for blood and look for other causes of blood loss.  TREATMENT  Treatment varies depending on the cause of the anemia. Treatment can include:   Supplements of iron, vitamin B12, or folic acid.   Hormone medicines.   A blood transfusion. This may be needed if blood loss is severe.   Hospitalization. This may be needed if there is significant continual blood loss.   Dietary changes.  Spleen removal. HOME CARE INSTRUCTIONS Keep all follow-up appointments. It often takes many weeks to correct anemia, and having your health care provider check on your condition and your response to  treatment is very important. SEEK IMMEDIATE MEDICAL CARE IF:   You develop extreme weakness, shortness of breath, or chest pain.   You become dizzy or have trouble concentrating.  You develop heavy vaginal bleeding.   You develop a rash.   You have bloody or black, tarry stools.   You faint.   You vomit up blood.   You vomit repeatedly.   You have abdominal pain.  You have a fever or persistent symptoms for more than 2-3 days.   You have a fever and your symptoms suddenly get worse.   You are dehydrated.  MAKE SURE YOU:  Understand these instructions.  Will watch your condition.  Will get help right away if you are not doing well or get worse. Document Released: 04/24/2004 Document Revised: 11/17/2012 Document Reviewed: 09/10/2012 ExitCare Patient Information 2015 [employer]. This information is not intended to replace advice given to you by your health care provider. Make sure you discuss any questions you have with your health care provider.  

## 2014-01-24 DIAGNOSIS — D649 Anemia, unspecified: Secondary | ICD-10-CM | POA: Diagnosis present

## 2014-01-24 DIAGNOSIS — R634 Abnormal weight loss: Secondary | ICD-10-CM | POA: Diagnosis present

## 2014-01-24 DIAGNOSIS — Z6822 Body mass index (BMI) 22.0-22.9, adult: Secondary | ICD-10-CM | POA: Diagnosis not present

## 2014-01-24 DIAGNOSIS — R42 Dizziness and giddiness: Secondary | ICD-10-CM | POA: Diagnosis present

## 2014-01-24 DIAGNOSIS — Z79899 Other long term (current) drug therapy: Secondary | ICD-10-CM | POA: Diagnosis not present

## 2014-01-24 DIAGNOSIS — N930 Postcoital and contact bleeding: Secondary | ICD-10-CM | POA: Diagnosis present

## 2014-01-24 LAB — TYPE AND SCREEN
ABO/RH(D): O POS
ANTIBODY SCREEN: NEGATIVE
Unit division: 0
Unit division: 0

## 2014-01-24 LAB — BASIC METABOLIC PANEL
ANION GAP: 9 (ref 5–15)
BUN: 7 mg/dL (ref 6–23)
CALCIUM: 8.2 mg/dL — AB (ref 8.4–10.5)
CO2: 23 mEq/L (ref 19–32)
Chloride: 107 mEq/L (ref 96–112)
Creatinine, Ser: 0.52 mg/dL (ref 0.50–1.10)
GFR calc Af Amer: >90 mL/min (ref >90)
Glucose, Bld: 96 mg/dL (ref 70–99)
POTASSIUM: 4 meq/L (ref 3.7–5.3)
SODIUM: 139 meq/L (ref 137–147)

## 2014-01-24 LAB — CBC
HCT: 24.3 % — ABNORMAL LOW (ref 36.0–46.0)
Hemoglobin: 7.2 g/dL — ABNORMAL LOW (ref 12.0–15.0)
MCH: 20.9 pg — ABNORMAL LOW (ref 26.0–34.0)
MCHC: 29.6 g/dL — ABNORMAL LOW (ref 30.0–36.0)
MCV: 70.6 fL — ABNORMAL LOW (ref 78.0–100.0)
Platelets: 289 10*3/uL (ref 150–400)
RBC: 3.44 MIL/uL — AB (ref 3.87–5.11)
RDW: 21.6 % — AB (ref 11.5–15.5)
WBC: 8.7 10*3/uL (ref 4.0–10.5)

## 2014-01-24 LAB — COMPLETE METABOLIC PANEL WITH GFR
AST: 13 U/L (ref 0–37)
Albumin: 3.9 g/dL (ref 3.5–5.2)
Alkaline Phosphatase: 52 U/L (ref 39–117)
CO2: 26 mEq/L (ref 19–32)
Chloride: 107 mEq/L (ref 96–112)
Creat: 0.46 mg/dL — ABNORMAL LOW (ref 0.50–1.10)
GFR, Est Non African American: >89 mL/min
Total Bilirubin: 0.3 mg/dL (ref 0.2–1.2)

## 2014-01-24 LAB — COMPLETE METABOLIC PANEL WITHOUT GFR
ALT: 11 U/L (ref 0–35)
BUN: 6 mg/dL (ref 6–23)
Calcium: 8.7 mg/dL (ref 8.4–10.5)
GFR, Est African American: >89 mL/min
Glucose, Bld: 91 mg/dL (ref 70–99)
Potassium: 3.8 meq/L (ref 3.5–5.3)
Sodium: 139 meq/L (ref 135–145)
Total Protein: 6.9 g/dL (ref 6.0–8.3)

## 2014-01-24 LAB — PAP IG, CT-NG, RFX HPV ASCU
Chlamydia Probe Amp: NEGATIVE
GC Probe Amp: NEGATIVE

## 2014-01-24 LAB — IRON: Iron: <10 ug/dL — ABNORMAL LOW (ref 42–145)

## 2014-01-24 LAB — LIPID PANEL
Cholesterol: 136 mg/dL (ref 0–200)
HDL: 36 mg/dL — ABNORMAL LOW (ref >39)
LDL Cholesterol: 92 mg/dL (ref 0–99)
Total CHOL/HDL Ratio: 3.8 ratio
Triglycerides: 42 mg/dL (ref <150)
VLDL: 8 mg/dL (ref 0–40)

## 2014-01-24 LAB — TSH: TSH: 1.389 u[IU]/mL (ref 0.350–4.500)

## 2014-01-24 LAB — URINE CULTURE
Colony Count: NO GROWTH
Organism ID, Bacteria: NO GROWTH

## 2014-01-24 MED ORDER — FERROUS SULFATE 325 (65 FE) MG PO TBEC: 325.0000 mg | DELAYED_RELEASE_TABLET | Freq: Three times a day (TID) | ORAL | Status: DC

## 2014-01-24 MED ORDER — FOLIC ACID 1 MG PO TABS: 1.0000 mg | ORAL_TABLET | Freq: Every day | ORAL | Status: DC

## 2014-01-24 MED ORDER — ENSURE COMPLETE PO LIQD: 237.0000 mL | Freq: Two times a day (BID) | ORAL | Status: DC

## 2014-01-24 NOTE — Progress Notes (Signed)
UR completed 

## 2014-01-24 NOTE — Progress Notes (Signed)
INITIAL NUTRITION ASSESSMENT  Pt meets criteria for mild MALNUTRITION in the context of  Chronic illness as evidenced by weight loss of 9% in the past month and intake <75% for 1 month.  DOCUMENTATION CODES Per approved criteria  -Non-severe (moderate) malnutrition in the context of chronic illness   INTERVENTION: Continue regular diet.  Encouraged intake. Ensure Complete po BID, each supplement provides 350 kcal and 13 grams of protein Educated on increasing iron in her diet and a healthy diet and importance of adequate intake to avoid further weight loss.  Coupons for Ensure Provided.  Teach back method used.  NUTRITION DIAGNOSIS: Predicted sub optimal po intake related to decreased appetite as evidenced by weight loss..   Goal: Intake of meals and supplements to meet >90% estimated needs.  Monitor:  Intake, labs, weight trend  Reason for Assessment: MST  43 y.o. female  Admitting Dx: <principal problem not specified>  ASSESSMENT: Patient admitted with hemoglobin of 4.7 now 7.2 after 2 units of blood.  Cause is uterine bleeding.  Spoke with patient and husband using interpretor phone ([place] and speaks little [place].)  Patient stated that she eats 3 meals a day but with decreased intake secondary to decreased appetite over the past month.  UBW 120 lbs 1 month ago with an 11 lb weight loss during this time (9%).  States that a year ago, she was drinking Ensure occasionally because she was working and did not always have time to eat.  States that symptoms of headache, back pain and increased fatigue began in May.  Height: Ht Readings from Last 1 Encounters:  01/24/14 4' 10.5" (1.486 m)    Weight: Wt Readings from Last 1 Encounters:  01/24/14 109 lb 5.6 oz (49.601 kg)    Ideal Body Weight: 98 lbs  % Ideal Body Weight: 111  Wt Readings from Last 10 Encounters:  01/24/14 109 lb 5.6 oz (49.601 kg)  01/23/14 106 lb (48.081 kg)    Usual Body Weight: 120  % Usual  Body Weight: 91  BMI:  Body mass index is 22.46 kg/(m^2).  Estimated Nutritional Needs: Kcal: 1400-1500 Protein: 50-60 gm Fluid: >1.4L daily  Skin: intact  Diet Order: General  EDUCATION NEEDS: -Education needs addressed   Intake/Output Summary (Last 24 hours) at 01/24/14 1145 Last data filed at 01/23/14 2301  Gross per 24 hour  Intake     30 ml  Output      0 ml  Net     30 ml      Labs:   Recent Labs Lab 01/23/14 1216 01/23/14 1446 01/24/14 0520  NA 139 141 139  K 3.8 4.5 4.0  CL 107 106 107  CO2 26 22 23   BUN 6 6 7   CREATININE 0.46* 0.46* 0.52  CALCIUM 8.7 9.1 8.2*  GLUCOSE 91 135* 96    CBG (last 3)  No results found for this basename: GLUCAP,  in the last 72 hours  Scheduled Meds: . sodium chloride   Intravenous STAT    Continuous Infusions:   Past Medical History  Diagnosis Date  . Medical history non-contributory     Past Surgical History  Procedure Laterality Date  . Cesarean section      x 2    Antonieta Iba, RD, LDN Clinical Inpatient Dietitian Pager:  [phone] Weekend and after hours pager:  [phone]

## 2014-01-24 NOTE — Discharge Summary (Signed)
Physician Discharge Summary  Candace Mccormick MCN:[id number] DOB: 1971-02-25 DOA: 01/23/2014  PCP: No PCP Per Patient  Admit date: 01/23/2014 Discharge date: 01/24/2014  Time spent: 35 minutes  Recommendations for Outpatient Follow-up:  1. Follow up with PCP in 1-2 weeks 2. Follow up with Dr. Elly Modena as soon as possible  Discharge Diagnoses:  Active Problems:   Anemia   Vaginal bleeding   Discharge Condition: Improved  Diet recommendation: Regular  Filed Weights   01/23/14 2222 01/24/14 0700  Weight: 49.6 kg (109 lb 5.6 oz) 49.601 kg (109 lb 5.6 oz)    History of present illness:  Please see admit h and p from 10/26 for details. Briefly, pt presents with fatigue, dizziness, and sob. Pt was seen by PCP who noted irregularity of the cervix in the setting of heavy vaginal bleeding. The patient was noted to have hgb of 4.7 in the ED. She was admitted for further work up.  Hospital Course:  The patient was admitted to the floor. The patient was continued on a total of 2 units of prbc's with appropriate correction of her HGB to over 7. The patient returned to her baseline state the following day without further complaints. As already noted, the patient is noted to have concerns of an abnormal hypoechoic soft tissue protruding from the cervical os. Ob/Gyn was consulted with recommendations to follow up closely. The patient understands to contact gyn soon after discharge for appointment. Contact info is provided to patient. The patient is otherwise medically stable for discharge with close outpatient follow up with GYN.  Consultations:  GYN - recs for follow up on discharge  Discharge Exam: Filed Vitals:   01/24/14 0130 01/24/14 0625 01/24/14 0700 01/24/14 1445  BP: 98/55 92/50  100/45  Pulse: 77 60  64  Temp: 98.8 F (37.1 C) 98.1 F (36.7 C)  97 F (36.1 C)  TempSrc: Oral Oral  Oral  Resp: 16 16  16   Height:   4' 10.5" (1.486 m)   Weight:   49.601 kg (109 lb 5.6 oz)   SpO2:  100% 100%  100%    General: awake, in nad Cardiovascular: regular, s1, s2 Respiratory: normal resp effort, no wheezing  Discharge Instructions     Medication List         ferrous sulfate 325 (65 FE) MG EC tablet  Take 1 tablet (325 mg total) by mouth 3 (three) times daily with meals.     FISH OIL PO  Take 1 tablet by mouth every evening.     folic acid 1 MG tablet  Commonly known as:  FOLVITE  Take 1 tablet (1 mg total) by mouth daily.     ibuprofen 200 MG tablet  Commonly known as:  ADVIL,MOTRIN  Take 200 mg by mouth 3 (three) times daily.       No Known Allergies Follow-up Information   Schedule an appointment as soon as possible for a visit with Follow up with your PCP within one to two weeks.      Schedule an appointment as soon as possible for a visit with CONSTANT,PEGGY, MD.   Specialty:  Obstetrics and Gynecology   Contact information:   [place] [address] [phone]        The results of significant diagnostics from this hospitalization (including imaging, microbiology, ancillary and laboratory) are listed below for reference.    Significant Diagnostic Studies: Dg Pelvis 1-2 Views  01/23/2014   CLINICAL DATA:  Right sided low  back pain without sciatica. Pain in joint, pelvic region and thigh, unspecified laterality.  EXAM: PELVIS - 1-2 VIEW  COMPARISON:  None.  FINDINGS: Hips are symmetric. No joint space narrowing or degenerative change. Obturator rings are intact. Sacroiliac joints are symmetric.  IMPRESSION: Negative.   Electronically Signed   By: Lorin Picket M.D.   On: 01/23/2014 14:29   Dg Abd 1 View  01/23/2014   CLINICAL DATA:  Right-sided low back pain without sciatica M54.5 (ICD-10-CM) Pain in joint, pelvic region and thigh, unspecified laterality M25.559 (ICD-10-CM). low back pain  EXAM: ABDOMEN - 1 VIEW  COMPARISON:  Pelvic films of same date  FINDINGS: Minimal convex left lumbar spine curvature. Nonobstructive bowel  gas pattern. No abnormal abdominal calcifications. No appendicolith. Minimal motion degradation. Minimal distal gas.  IMPRESSION: No acute findings.   Electronically Signed   By: Abigail Miyamoto M.D.   On: 01/23/2014 14:29   US Transvaginal Non-ob  01/23/2014   CLINICAL DATA:  Cervix abnormality on current physical exam. Vaginal discharge. Vaginal bleeding. Low hemoglobin.  EXAM: TRANSABDOMINAL AND TRANSVAGINAL ULTRASOUND OF PELVIS  TECHNIQUE: Both transabdominal and transvaginal ultrasound examinations of the pelvis were performed. Transabdominal technique was performed for global imaging of the pelvis including uterus, ovaries, adnexal regions, and pelvic cul-de-sac. It was necessary to proceed with endovaginal exam following the transabdominal exam to visualize the uterus, endometrium and ovaries to better advantage.  COMPARISON:  None  FINDINGS: Uterus  Measurements: 10.9 cm x 4.9 cm x 5.6 cm. Heterogeneous echogenicity. 2 cm right fundal subserosal fibroid. No other uterine masses.  Endometrium  Thickness: 6.3 mm. Area of vascularized soft tissue, hypoechoic, protrudes from the posterior aspect of the endometrium through the cervical canal to the external cervical os. This measures 7.2 mm in thickness at the level of the cervical canal.  Right ovary  Measurements: 2.1 cm x 1.3 cm x 1.6 cm. Normal appearance/no adnexal mass.  Left ovary  Measurements: 4.1 cm x 1.7 cm x 2.5 cm. Two adjacent ovarian cysts, largest measuring 19 mm, presumed to be physiologic cysts given this patient's age. Ovary otherwise unremarkable. No adnexal mass.  Other findings  Trace, likely physiologic, free fluid.  IMPRESSION: 1. Abnormal hypoechoic soft tissue extends from the posterior margin of the lower endometrium through the endocervix to protrudes from the external cervical os. This soft tissue is vascular. Was could reflect a pedunculated polyp. Consider further evaluation with sonohysterogram for confirmation prior to  hysteroscopy. Endometrial sampling should also be considered if patient is at high risk for endometrial carcinoma. (Ref: Radiological Reasoning: Algorithmic Workup of Abnormal Vaginal Bleeding with Endovaginal Sonography and Sonohysterography. AJR 2008; 016:W10-93) 2. Small right uterine fundal sub serosal fibroid. 3. No other abnormalities.   Electronically Signed   By: Lajean Manes M.D.   On: 01/23/2014 19:04   US Pelvis Complete  01/23/2014   CLINICAL DATA:  Cervix abnormality on current physical exam. Vaginal discharge. Vaginal bleeding. Low hemoglobin.  EXAM: TRANSABDOMINAL AND TRANSVAGINAL ULTRASOUND OF PELVIS  TECHNIQUE: Both transabdominal and transvaginal ultrasound examinations of the pelvis were performed. Transabdominal technique was performed for global imaging of the pelvis including uterus, ovaries, adnexal regions, and pelvic cul-de-sac. It was necessary to proceed with endovaginal exam following the transabdominal exam to visualize the uterus, endometrium and ovaries to better advantage.  COMPARISON:  None  FINDINGS: Uterus  Measurements: 10.9 cm x 4.9 cm x 5.6 cm. Heterogeneous echogenicity. 2 cm right fundal subserosal fibroid. No other uterine masses.  Endometrium  Thickness: 6.3 mm. Area of vascularized soft tissue, hypoechoic, protrudes from the posterior aspect of the endometrium through the cervical canal to the external cervical os. This measures 7.2 mm in thickness at the level of the cervical canal.  Right ovary  Measurements: 2.1 cm x 1.3 cm x 1.6 cm. Normal appearance/no adnexal mass.  Left ovary  Measurements: 4.1 cm x 1.7 cm x 2.5 cm. Two adjacent ovarian cysts, largest measuring 19 mm, presumed to be physiologic cysts given this patient's age. Ovary otherwise unremarkable. No adnexal mass.  Other findings  Trace, likely physiologic, free fluid.  IMPRESSION: 1. Abnormal hypoechoic soft tissue extends from the posterior margin of the lower endometrium through the endocervix to  protrudes from the external cervical os. This soft tissue is vascular. Was could reflect a pedunculated polyp. Consider further evaluation with sonohysterogram for confirmation prior to hysteroscopy. Endometrial sampling should also be considered if patient is at high risk for endometrial carcinoma. (Ref: Radiological Reasoning: Algorithmic Workup of Abnormal Vaginal Bleeding with Endovaginal Sonography and Sonohysterography. AJR 2008; 194:R74-08) 2. Small right uterine fundal sub serosal fibroid. 3. No other abnormalities.   Electronically Signed   By: Lajean Manes M.D.   On: 01/23/2014 19:04    Microbiology: Recent Results (from the past 240 hour(s))  URINE CULTURE     Status: None   Collection Time    01/23/14  1:50 PM      Result Value Ref Range Status   Colony Count NO GROWTH   Final   Organism ID, Bacteria NO GROWTH   Final     Labs: Basic Metabolic Panel:  Recent Labs Lab 01/23/14 1216 01/23/14 1446 01/24/14 0520  NA 139 141 139  K 3.8 4.5 4.0  CL 107 106 107  CO2 26 22 23   GLUCOSE 91 135* 96  BUN 6 6 7   CREATININE 0.46* 0.46* 0.52  CALCIUM 8.7 9.1 8.2*   Liver Function Tests:  Recent Labs Lab 01/23/14 1216 01/23/14 1446  AST 13 21  ALT 11 13  ALKPHOS 52 64  BILITOT 0.3 0.3  PROT 6.9 7.5  ALBUMIN 3.9 3.7   No results found for this basename: LIPASE, AMYLASE,  in the last 168 hours No results found for this basename: AMMONIA,  in the last 168 hours CBC:  Recent Labs Lab 01/23/14 1219 01/23/14 1446 01/24/14 0520  WBC 5.5 6.4 8.7  HGB 4.7* 5.0* 7.2*  HCT 18.2* 20.5* 24.3*  MCV 61.9* 65.7* 70.6*  PLT  --  433* 289   Cardiac Enzymes: No results found for this basename: CKTOTAL, CKMB, CKMBINDEX, TROPONINI,  in the last 168 hours BNP: BNP (last 3 results) No results found for this basename: PROBNP,  in the last 8760 hours CBG: No results found for this basename: GLUCAP,  in the last 168 hours   Signed:  Chakira Jachim K  [facility] 01/24/2014, 5:38 PM

## 2014-01-25 ENCOUNTER — Ambulatory Visit (INDEPENDENT_AMBULATORY_CARE_PROVIDER_SITE_OTHER): Payer: 59 | Admitting: Obstetrics and Gynecology

## 2014-01-25 ENCOUNTER — Encounter: Payer: Self-pay | Admitting: Obstetrics and Gynecology

## 2014-01-25 ENCOUNTER — Other Ambulatory Visit (HOSPITAL_COMMUNITY)
Admission: RE | Admit: 2014-01-25 | Discharge: 2014-01-25 | Disposition: A | Discharge status: Home / Self Care | Payer: 59 | Source: Ambulatory Visit | Attending: Obstetrics and Gynecology | Admitting: Obstetrics and Gynecology

## 2014-01-25 VITALS — BP 109/50 | HR 64 | Wt 105.6 lb

## 2014-01-25 DIAGNOSIS — N939 Abnormal uterine and vaginal bleeding, unspecified: Secondary | ICD-10-CM

## 2014-01-25 LAB — POCT PREGNANCY, URINE: Preg Test, Ur: NEGATIVE

## 2014-01-25 NOTE — Care Management Note (Signed)
CARE MANAGEMENT NOTE 01/25/2014  Patient:  Candace Mccormick,Candace Mccormick   Account Number:  [id number]  Date Initiated:  01/24/2014  Documentation initiated by:  Marney Doctor  Subjective/Objective Assessment:   43 yo patient admitted with anemia     Action/Plan:   From home with family   Anticipated DC Date:  01/25/2014   Anticipated DC Plan:  [place]  CM consult      Choice offered to / List presented to:             Status of service:  Completed, signed off Medicare Important Message given?   (If response is "NO", the following Medicare IM given date fields will be blank) Date Medicare IM given:   Medicare IM given by:   Date Additional Medicare IM given:   Additional Medicare IM given by:    Discharge Disposition:    Per UR Regulation:  Reviewed for med. necessity/level of care/duration of stay  If discussed at [place] of Stay Meetings, dates discussed:    Comments:  01/25/14 Marney Doctor RN,BSN,NCM Met with pt on 01/24/14 regarding cm consult "trouble affording meds".  Pt currently has [employer] and is not on any prescription meds.  Pt states that she does not think she will be discharged on any prescriptions.  Spoke with staff RN and asked that if the pt is DC'd on prescriptions that the MD be asked to try lower cost option meds.  No other CM needs identified at this time.

## 2014-01-25 NOTE — Progress Notes (Signed)
Patient ID: Candace Mccormick, female   DOB: September 25, 1970, 43 y.o.   MRN: [id number] 43 yo G3P3 presenting today as an ED follow up for the evaluation of symptomatic anemia related to the menorrhagia. Patient reports seeing her PCP a few days ago who performed a pelvic exam and informed her of an abnormality on her cervix. Patient was subsequently called and informed that she was in need of a blood transfusion. Patient reports monthly cycles lasting 14 days which are heavy the first 2-3 days, she denies passage of clots. Patient reports being evaluated for this a few years ago while in [place] but without any interventions. Patient is sexually active using BTL for contraception.  Past Medical History  Diagnosis Date  . Medical history non-contributory    Past Surgical History  Procedure Laterality Date  . Cesarean section      x 2   No family history on file. History  Substance Use Topics  . Smoking status: Never Smoker   . Smokeless tobacco: Never Used  . Alcohol Use: No   Physical exam GENERAL: Well-developed, well-nourished female in no acute distress.  ABDOMEN: Soft, nontender, nondistended. No organomegaly. PELVIC: Normal external female genitalia. Vagina is pink and rugated.  Normal discharge. Normal appearing cervix with a polypoid mass measuring 3 cm visualized extending from the external os. Uterus is normal in size. No adnexal mass or tenderness. EXTREMITIES: No cyanosis, clubbing, or edema, 2+ distal pulses.    US Pelvis Complete  01/23/2014 CLINICAL DATA: Cervix abnormality on current physical exam. Vaginal discharge. Vaginal bleeding. Low hemoglobin. EXAM: TRANSABDOMINAL AND TRANSVAGINAL ULTRASOUND OF PELVIS TECHNIQUE: Both transabdominal and transvaginal ultrasound examinations of the pelvis were performed. Transabdominal technique was performed for global imaging of the pelvis including uterus, ovaries, adnexal regions, and pelvic cul-de-sac. It was necessary to proceed with  endovaginal exam following the transabdominal exam to visualize the uterus, endometrium and ovaries to better advantage. COMPARISON: None FINDINGS: Uterus Measurements: 10.9 cm x 4.9 cm x 5.6 cm. Heterogeneous echogenicity. 2 cm right fundal subserosal fibroid. No other uterine masses. Endometrium Thickness: 6.3 mm. Area of vascularized soft tissue, hypoechoic, protrudes from the posterior aspect of the endometrium through the cervical canal to the external cervical os. This measures 7.2 mm in thickness at the level of the cervical canal. Right ovary Measurements: 2.1 cm x 1.3 cm x 1.6 cm. Normal appearance/no adnexal mass. Left ovary Measurements: 4.1 cm x 1.7 cm x 2.5 cm. Two adjacent ovarian cysts, largest measuring 19 mm, presumed to be physiologic cysts given this patient's age. Ovary otherwise unremarkable. No adnexal mass. Other findings Trace, likely physiologic, free fluid. IMPRESSION: 1. Abnormal hypoechoic soft tissue extends from the posterior margin of the lower endometrium through the endocervix to protrudes from the external cervical os. This soft tissue is vascular. Was could reflect a pedunculated polyp. Consider further evaluation with sonohysterogram for confirmation prior to hysteroscopy. Endometrial sampling should also be considered if patient is at high risk for endometrial carcinoma. (Ref: Radiological Reasoning: Algorithmic Workup of Abnormal Vaginal Bleeding with Endovaginal Sonography and Sonohysterography. AJR 2008; 937:T02-40) 2. Small right uterine fundal sub serosal fibroid. 3. No other abnormalities. Electronically Signed By: Lajean Manes M.D. On: 01/23/2014 19:04   A/P 43 yo with abnormal uterine bleeding causing anemia - Discussed performing an endometrial biopsy to rule out endometrial carcinoma ENDOMETRIAL BIOPSY     The indications for endometrial biopsy were reviewed.   Risks of the biopsy including cramping, bleeding, infection, uterine perforation, inadequate specimen and  need for additional procedures  were discussed. The patient states she understands and agrees to undergo procedure today. Consent was signed. Time out was performed. Urine HCG was negative. A sterile speculum was placed in the patient's vagina and the cervix was prepped with Betadine. A single-toothed tenaculum was placed on the anterior lip of the cervix to stabilize it. The uterine cavity was sounded to a depth of 9 cm using the uterine sound. The 3 mm pipelle was introduced into the endometrial cavity without difficulty, 2 passes were made.  A  moderate amount of tissue was sent to pathology. The instruments were removed from the patient's vagina. Minimal bleeding from the cervix was noted. The patient tolerated the procedure well.  Routine post-procedure instructions were given to the patient. The patient will follow up in two weeks to review the results and for further management.   A polypectomy was also performed prior to the endometrial biopsy. The polyp was grasped with a ring forceps and removed without difficulty.  - the patient will be contacted with any abnormal results - patient was asked to keep track of a menstrual calendar - RTC prn pending results

## 2014-01-27 ENCOUNTER — Inpatient Hospital Stay (HOSPITAL_COMMUNITY)
Admission: AD | Admit: 2014-01-27 | Discharge: 2014-01-27 | Disposition: A | Discharge status: Home / Self Care | Payer: 59 | Source: Ambulatory Visit | Attending: Family Medicine | Admitting: Family Medicine

## 2014-01-27 ENCOUNTER — Inpatient Hospital Stay (HOSPITAL_COMMUNITY): Payer: 59

## 2014-01-27 ENCOUNTER — Encounter (HOSPITAL_COMMUNITY): Payer: Self-pay

## 2014-01-27 DIAGNOSIS — R109 Unspecified abdominal pain: Secondary | ICD-10-CM | POA: Diagnosis not present

## 2014-01-27 DIAGNOSIS — N83202 Unspecified ovarian cyst, left side: Secondary | ICD-10-CM

## 2014-01-27 DIAGNOSIS — N8320 Unspecified ovarian cysts: Secondary | ICD-10-CM | POA: Diagnosis not present

## 2014-01-27 DIAGNOSIS — D259 Leiomyoma of uterus, unspecified: Secondary | ICD-10-CM | POA: Diagnosis not present

## 2014-01-27 DIAGNOSIS — D509 Iron deficiency anemia, unspecified: Secondary | ICD-10-CM

## 2014-01-27 DIAGNOSIS — N939 Abnormal uterine and vaginal bleeding, unspecified: Secondary | ICD-10-CM

## 2014-01-27 DIAGNOSIS — R1032 Left lower quadrant pain: Secondary | ICD-10-CM

## 2014-01-27 LAB — URINE MICROSCOPIC-ADD ON

## 2014-01-27 LAB — CBC
HCT: 29.1 % — ABNORMAL LOW (ref 36.0–46.0)
Hemoglobin: 8.2 g/dL — ABNORMAL LOW (ref 12.0–15.0)
MCH: 20.8 pg — AB (ref 26.0–34.0)
MCHC: 28.2 g/dL — ABNORMAL LOW (ref 30.0–36.0)
MCV: 73.9 fL — AB (ref 78.0–100.0)
Platelets: 298 10*3/uL (ref 150–400)
RBC: 3.94 MIL/uL (ref 3.87–5.11)
RDW: 24.7 % — ABNORMAL HIGH (ref 11.5–15.5)
WBC: 9.7 10*3/uL (ref 4.0–10.5)

## 2014-01-27 LAB — URINALYSIS, ROUTINE W REFLEX MICROSCOPIC
Bilirubin Urine: NEGATIVE
Glucose, UA: NEGATIVE mg/dL
Ketones, ur: NEGATIVE mg/dL
LEUKOCYTES UA: NEGATIVE
Nitrite: NEGATIVE
PROTEIN: NEGATIVE mg/dL
Specific Gravity, Urine: 1.01 (ref 1.005–1.030)
Urobilinogen, UA: 0.2 mg/dL (ref 0.0–1.0)
pH: 7 (ref 5.0–8.0)

## 2014-01-27 MED ORDER — HYDROCODONE-ACETAMINOPHEN 5-325 MG PO TABS: 1.0000 | ORAL_TABLET | ORAL | Status: DC | PRN

## 2014-01-27 MED ORDER — KETOROLAC TROMETHAMINE 60 MG/2ML IM SOLN
60.0000 mg | Freq: Once | INTRAMUSCULAR | Status: AC
  Administered 2014-01-27: 60 mg via INTRAMUSCULAR
  Filled 2014-01-27: qty 2

## 2014-01-27 MED ORDER — HYDROMORPHONE HCL 1 MG/ML IJ SOLN
1.0000 mg | Freq: Once | INTRAMUSCULAR | Status: AC
  Administered 2014-01-27: 1 mg via INTRAMUSCULAR
  Filled 2014-01-27: qty 1

## 2014-01-27 MED ORDER — HYDROCODONE-ACETAMINOPHEN 5-325 MG PO TABS: 1.0000 | ORAL_TABLET | Freq: Once | ORAL | Status: DC

## 2014-01-27 NOTE — MAU Provider Note (Signed)
History     CSN: [id number]  Arrival date and time: 01/27/14 1745   First Provider Initiated Contact with Patient 01/27/14 1843      Chief Complaint  Patient presents with  . Abdominal Pain   HPI  [name] is a 43 y.o. 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 The pain was not in her abdomen prior to her polypectomy. She has tried taking ibuprofen, she has taken 600 mg ibuprofen without relief. The last time she took ibuprofen was last night; the medication did not help. Of note, she does take daily Ibuprofen 600 mg daily.  She rates her pain 10/10. The last time she has had a BM was this morning and it was normal. She is taking daily iron due to history of anemia with blood transfusion a few days ago.  Vietnamese/ pacific interpretor used.   OB History   Grav Para Term Preterm Abortions TAB SAB Ect Mult Living   4 3 3  1  1   3       Past Medical History  Diagnosis Date  . Medical history non-contributory     Past Surgical History  Procedure Laterality Date  . Cesarean section      x 2    History reviewed. No pertinent family history.  History  Substance Use Topics  . Smoking status: Never Smoker   . Smokeless tobacco: Never Used  . Alcohol Use: No    Allergies: No Known Allergies  Prescriptions prior to admission  Medication Sig Dispense Refill  . ferrous sulfate 325 (65 FE) MG EC tablet Take 1 tablet (325 mg total) by mouth 3 (three) times daily with meals.  90 tablet  0  . ferrous sulfate 325 (65 FE) MG tablet Take 325 mg by mouth daily with breakfast.      . folic acid (FOLVITE) 1 MG tablet Take 1 mg by mouth daily.      . folic acid (FOLVITE) 818 MCG tablet Take 400 mcg by mouth daily.      Marland Kitchen ibuprofen (ADVIL,MOTRIN) 200 MG tablet Take 600 mg by mouth every 8 (eight) hours as needed for mild pain.       . Omega-3 Fatty Acids (FISH OIL PO) Take 1 tablet by  mouth every evening.       Results for orders placed during the hospital encounter of 01/27/14 (from the past 48 hour(s))  URINALYSIS, ROUTINE W REFLEX MICROSCOPIC     Status: Abnormal   Collection Time    01/27/14  6:02 PM      Result Value Ref Range   Color, Urine YELLOW  YELLOW   APPearance CLEAR  CLEAR   Specific Gravity, Urine 1.010  1.005 - 1.030   pH 7.0  5.0 - 8.0   Glucose, UA NEGATIVE  NEGATIVE mg/dL   Hgb urine dipstick LARGE (*) NEGATIVE   Bilirubin Urine NEGATIVE  NEGATIVE   Ketones, ur NEGATIVE  NEGATIVE mg/dL   Protein, ur NEGATIVE  NEGATIVE mg/dL   Urobilinogen, UA 0.2  0.0 - 1.0 mg/dL   Nitrite NEGATIVE  NEGATIVE   Leukocytes, UA NEGATIVE  NEGATIVE  URINE MICROSCOPIC-ADD ON     Status: Abnormal   Collection Time    01/27/14  6:02 PM      Result Value Ref Range   Squamous Epithelial / LPF FEW (*) RARE   WBC, UA 0-2  <  3 WBC/hpf   RBC / HPF 0-2  <3 RBC/hpf   US Transvaginal Non-ob  01/27/2014   CLINICAL DATA:  Abdomen and back pain since yesterday. The patient had endometrial biopsy and polypectomy on 01/25/2014.  EXAM: ULTRASOUND PELVIS TRANSVAGINAL  TECHNIQUE: Transvaginal ultrasound examination of the pelvis was performed including evaluation of the uterus, ovaries, adnexal regions, and pelvic cul-de-sac.  COMPARISON:  Ultrasound dated 01/23/2014  FINDINGS: Uterus  Measurements: 10.3 x 5.9 x 6.1 cm. 2 cm right fundal subserosal fibroid, unchanged.  Endometrium  Thickness: 17 mm. The endometrium is thickened and heterogeneous with internal vascularity similar to the appearance on the prior study.  Right ovary  Measurements: 2.5 x 1.1 x 1.7 cm. Normal appearance/no adnexal mass.  Left ovary  Measurements: 3.8 x 2.1 x 3.5 cm. 2.2 cm cyst on the left ovary, unchanged.  Other findings:  Trace free fluid.  IMPRESSION: Heterogeneous thickened endometrium similar to the appearance on the prior exam although there is no protruding soft tissue through the cervix on the current  exam, consistent with interval polypectomy.   Electronically Signed   By: Rozetta Nunnery M.D.   On: 01/27/2014 22:41    Review of Systems  Constitutional: Negative for fever and chills.  Gastrointestinal: Positive for abdominal pain (abdominal pain wraps around to her back. ). Negative for nausea, vomiting, diarrhea and constipation.   Physical Exam   Blood pressure 106/57, pulse 60, temperature 99 F (37.2 C), resp. rate 16, height 4\' 9"  (1.448 m), weight 48.716 kg (107 lb 6.4 oz), last menstrual period 01/07/2014.  Physical Exam  Constitutional: She is oriented to person, place, and time. Vital signs are normal. She appears well-developed and well-nourished.  Non-toxic appearance. She has a sickly appearance. She does not appear ill. No distress.  HENT:  Head: Normocephalic.  Eyes: Pupils are equal, round, and reactive to light.  Neck: Neck supple.  GI: Soft. There is tenderness in the suprapubic area and left lower quadrant. There is rigidity. There is no rebound and no guarding.  Genitourinary:  Speculum exam: Vagina - Small amount of mucus like, brown discharge, no odor Cervix - No contact bleeding, no active bleeding  Bimanual exam: Cervix closed Uterus non tender, normal size Adnexa non tender, no masses bilaterally, + suprapubic tenderness  Chaperone present for exam.   Musculoskeletal: Normal range of motion.  Neurological: She is alert and oriented to person, place, and time.  Skin: Skin is warm. She is not diaphoretic. There is pallor.  Psychiatric: Her behavior is normal.    MAU Course  Procedures None  MDM Toradol 60 mg IM  No relief from toradol, will given 1 mg of dilaudid and send for pelvic US  Pain relieved with dilaudid  Patient feeling much better at the time of discharge; eating and drinking in the room with her family.   Assessment and Plan   A: Abdominal pain in female Uterine fibroid  Left ovarian cyst   P: Discharge home in stable  condition RX: Vicodin  Cautioned her use of daily ibuprofen  Return to MAU if symptoms worsen Keep follow up appointment as scheduled; follow up in PCP if symptoms do not improve.   Darrelyn Hillock Rasch, NP 01/27/2014 11:20 PM

## 2014-01-27 NOTE — MAU Note (Signed)
PT felt alittle shaky and vomited small amt. Has not eaten since 1500. Has egg and cheese biscuit with her husband heated. Will eat alittle and see how she feels. Lying in bed.

## 2014-01-27 NOTE — MAU Note (Signed)
Patient states she started having abdominal pain last night. No bleeding today. Per record had an endometrial biopsy on 10-28,

## 2014-01-27 NOTE — MAU Note (Signed)
Pt left with family before RN returned with w/c for d/c

## 2014-01-27 NOTE — Progress Notes (Signed)
Noni Saupe NP in earlier to discuss d/c plan. Written and verbal d/c instructions given with help of [place] interpreter.

## 2014-01-28 NOTE — MAU Provider Note (Signed)
Attestation of Attending Supervision of Advanced Practitioner (PA/CNM/NP): Evaluation and management procedures were performed by the Advanced Practitioner under my supervision and collaboration.  I have reviewed the Advanced Practitioner's note and chart, and I agree with the management and plan.  Donnamae Jude, MD [facility] Attending 01/28/2014 12:13 AM

## 2014-01-30 ENCOUNTER — Encounter (HOSPITAL_COMMUNITY): Payer: Self-pay

## 2014-01-31 ENCOUNTER — Telehealth: Payer: Self-pay | Admitting: Family Medicine

## 2014-01-31 NOTE — Telephone Encounter (Signed)
Spoke with husband about labs, recheck hgb in 2 months, her hgb last encounter for abd pain s/p polypectomy was 8. Told them to take iron and folic acid, may cause constipation  And black stools.

## 2014-03-17 ENCOUNTER — Encounter: Payer: Self-pay | Admitting: *Deleted

## 2014-10-26 ENCOUNTER — Encounter: Payer: Self-pay | Admitting: Family Medicine

## 2014-10-26 ENCOUNTER — Ambulatory Visit (INDEPENDENT_AMBULATORY_CARE_PROVIDER_SITE_OTHER): Payer: 59 | Admitting: Family Medicine

## 2014-10-26 VITALS — BP 118/70 | HR 59 | Temp 97.7°F | Resp 16 | Ht <= 58 in | Wt 114.0 lb

## 2014-10-26 DIAGNOSIS — M545 Low back pain: Secondary | ICD-10-CM | POA: Diagnosis not present

## 2014-10-26 DIAGNOSIS — D649 Anemia, unspecified: Secondary | ICD-10-CM | POA: Diagnosis not present

## 2014-10-26 DIAGNOSIS — N841 Polyp of cervix uteri: Secondary | ICD-10-CM | POA: Diagnosis not present

## 2014-10-26 LAB — IRON: Iron: 69 ug/dL (ref 42–145)

## 2014-10-26 LAB — FERRITIN: Ferritin: 30 ng/mL (ref 10–291)

## 2014-10-26 LAB — POCT CBC
Granulocyte percent: 62.1 % (ref 37–80)
HCT, POC: 38.4 % (ref 37.7–47.9)
Hemoglobin: 13 g/dL (ref 12.2–16.2)
Lymph, poc: 2.5 (ref 0.6–3.4)
MCH, POC: 32 pg — AB (ref 27–31.2)
MCHC: 33.8 g/dL (ref 31.8–35.4)
MCV: 94.6 fL (ref 80–97)
MID (cbc): 0.7 (ref 0–0.9)
MPV: 6.3 fL (ref 0–99.8)
POC Granulocyte: 5.3 (ref 2–6.9)
POC LYMPH PERCENT: 29.7 % (ref 10–50)
POC MID %: 8.2 % (ref 0–12)
Platelet Count, POC: 275 10*3/uL (ref 142–424)
RBC: 4.06 M/uL (ref 4.04–5.48)
RDW, POC: 13.4 %
WBC: 8.5 10*3/uL (ref 4.6–10.2)

## 2014-10-26 LAB — IBC PANEL
%SAT: 20 % (ref 20–55)
TIBC: 349 ug/dL (ref 250–470)
UIBC: 280 ug/dL (ref 125–400)

## 2014-10-26 NOTE — Progress Notes (Signed)
Chief Complaint:  Chief Complaint  Patient presents with  . Anemia    HPI: Candace Mccormick is a 44 y.o. female who reports to [facility] today complaining of  Recheck of her anemia status. The last time I saw her was in October 2015 and she had a cervicalpolyp that was actively bleeding and a CBC was done and her hemoglobin was 4.7. She was advised to go [facility] for a transfusion and ultimately ended up getting a blood transfusion  and a OB/GYN consult as an outpatient. She got cervical polypectomy and path report was normal. She has not gone back to ob /gyn/  She is here for recheck. She's had slight bruising. She has some back pain and also has some bleeding with sex. It's very intermittent. Her  Symptoms are not sever and are  much improved. Her flow is currently 10 days long it is very light for the first 7 days and normal flow for the last 3 days. She denies any chest pain or shortness of breath or palpitations or dizziness or gum bleeding. She has been taking iron supplements and also multivitmain  Past Medical History  Diagnosis Date  . Cervical polyp   . Anemia    Past Surgical History  Procedure Laterality Date  . Cesarean section      x 2  . Cervical polypectomy     History   Social History  . Marital Status: Married    Spouse Name: N/A  . Number of Children: N/A  . Years of Education: N/A   Social History Main Topics  . Smoking status: Never Smoker   . Smokeless tobacco: Never Used  . Alcohol Use: No  . Drug Use: No  . Sexual Activity: Not on file   Other Topics Concern  . None   Social History Narrative   History reviewed. No pertinent family history. No Known Allergies Prior to Admission medications   Medication Sig Start Date End Date Taking? Authorizing Provider  ferrous sulfate 325 (65 FE) MG EC tablet Take 1 tablet (325 mg total) by mouth 3 (three) times daily with meals. 01/24/14   Donne Hazel, MD  ferrous sulfate 325 (65 FE) MG tablet Take 325  mg by mouth daily with breakfast.    Historical Provider, MD  folic acid (FOLVITE) 1 MG tablet Take 1 mg by mouth daily.    Historical Provider, MD  folic acid (FOLVITE) 063 MCG tablet Take 400 mcg by mouth daily.    Historical Provider, MD  ibuprofen (ADVIL,MOTRIN) 200 MG tablet Take 600 mg by mouth every 8 (eight) hours as needed for mild pain.     Historical Provider, MD  Omega-3 Fatty Acids (FISH OIL PO) Take 1 tablet by mouth every evening.    Historical Provider, MD     ROS: The patient denies fevers, chills, night sweats, unintentional weight loss, chest pain, palpitations, wheezing, dyspnea on exertion, nausea, vomiting, abdominal pain, dysuria, hematuria, melena, numbness, weakness, or tingling.   All other systems have been reviewed and were otherwise negative with the exception of those mentioned in the HPI and as above.    PHYSICAL EXAM: Filed Vitals:   10/26/14 1146  BP: 118/70  Pulse: 59  Temp: 97.7 F (36.5 C)  Resp: 16   Body mass index is 23.83 kg/(m^2).   General: Alert, no acute distress HEENT:  Normocephalic, atraumatic, oropharynx patent. EOMI, PERRLA Cardiovascular:  Regular rate and rhythm, no rubs murmurs or gallops.  No Carotid bruits, radial pulse intact. No pedal edema.  Respiratory: Clear to auscultation bilaterally.  No wheezes, rales, or rhonchi.  No cyanosis, no use of accessory musculature Abdominal: No organomegaly, abdomen is soft and non-tender, positive bowel sounds. No masses. Skin: No rashes. Neurologic: Facial musculature symmetric. Psychiatric: Patient acts appropriately throughout our interaction. Lymphatic: No cervical or submandibular lymphadenopathy Musculoskeletal: Gait intact. No edema, tenderness + cervical polyp, minimally bleeding   LABS: Results for orders placed or performed in visit on 10/26/14  Ferritin  Result Value Ref Range   Ferritin 30 10 - 291 ng/mL  IBC Panel  Result Value Ref Range   UIBC 280 125 - 400 ug/dL    TIBC 349 250 - 470 ug/dL   %SAT 20 20 - 55 %  Iron  Result Value Ref Range   Iron 69 42 - 145 ug/dL  POCT CBC  Result Value Ref Range   WBC 8.5 4.6 - 10.2 K/uL   Lymph, poc 2.5 0.6 - 3.4   POC LYMPH PERCENT 29.7 10 - 50 %L   MID (cbc) 0.7 0 - 0.9   POC MID % 8.2 0 - 12 %M   POC Granulocyte 5.3 2 - 6.9   Granulocyte percent 62.1 37 - 80 %G   RBC 4.06 4.04 - 5.48 M/uL   Hemoglobin 13.0 12.2 - 16.2 g/dL   HCT, POC 38.4 37.7 - 47.9 %   MCV 94.6 80 - 97 fL   MCH, POC 32.0 (A) 27 - 31.2 pg   MCHC 33.8 31.8 - 35.4 g/dL   RDW, POC 13.4 %   Platelet Count, POC 275 142 - 424 K/uL   MPV 6.3 0 - 99.8 fL     EKG/XRAY:   Primary read interpreted by Dr. Marin Comment at [facility].   ASSESSMENT/PLAN: Encounter Diagnoses  Name Primary?  Marland Kitchen Anemia, unspecified anemia type Yes  . Low back pain without sciatica, unspecified back pain laterality   . Cervical polyp    She has a recurrent of her cervical polyp, it is actively bleding but not like it was on initial presentation when I last saw her, she is status post polypectomy Since she stable I will refer her back to ob/gyn so she can get this re-evalauted Anemia labs pending, cont with iron  FU prn   Gross sideeffects, risk and benefits, and alternatives of medications d/w patient. Patient is aware that all medications have potential sideeffects and we are unable to predict every sideeffect or drug-drug interaction that may occur.  Hershel Corkery DO  10/30/2014 6:53 AM

## 2014-10-30 ENCOUNTER — Encounter: Payer: Self-pay | Admitting: Family Medicine

## 2014-11-02 ENCOUNTER — Encounter: Payer: Self-pay | Admitting: Family Medicine

## 2014-11-07 ENCOUNTER — Encounter: Payer: Self-pay | Admitting: Gynecology

## 2014-11-07 ENCOUNTER — Ambulatory Visit (INDEPENDENT_AMBULATORY_CARE_PROVIDER_SITE_OTHER): Payer: 59 | Admitting: Gynecology

## 2014-11-07 VITALS — BP 116/70 | Ht <= 58 in | Wt 112.0 lb

## 2014-11-07 DIAGNOSIS — N920 Excessive and frequent menstruation with regular cycle: Secondary | ICD-10-CM

## 2014-11-07 NOTE — Progress Notes (Signed)
Candace Mccormick 03-28-1971 [id number]        44 y.o.  [id number] new patient referred due to history of heavier menses and endometrial polyp.  Was evaluated last year 12/2013 with a hemoglobin of 5 found to have a polyp extruding from the cervix and she underwent removal of this polyp and endometrial biopsy both of which were benign. The ultrasound suggested that the polyp came from the posterior lower uterine segment.  Her most recent hemoglobin is 13 and she relates her menses lasting 10 days with heavy flow for the first 3 days. No intermenstrual bleeding.  Pap smear 12/2013 is normal. Status post BTL with her last C-section.    Past medical history,surgical history, problem list, medications, allergies, family history and social history were all reviewed and documented as reviewed in the EPIC chart.  ROS:  Performed with pertinent positives and negatives included in the history, assessment and plan.   Additional significant findings :  none   Exam: Kim Counsellor Vitals:   11/07/14 1156  BP: 116/70  Height: 4\' 10"  (1.473 m)  Weight: 112 lb (50.803 kg)   General appearance:  Normal affect, orientation and appearance. Skin: Grossly normal HEENT: Without gross lesions.  No cervical or supraclavicular adenopathy. Thyroid normal.  Lungs:  Clear without wheezing, rales or rhonchi Cardiac: RR, without RMG Abdominal:  Soft, nontender, without masses, guarding, rebound, organomegaly or hernia Breasts:  Examined lying and sitting without masses, retractions, discharge or axillary adenopathy. Pelvic:  Ext/BUS/vagina normal  Cervix normal  Uterus anteverted, normal size, shape and contour, midline and mobile nontender   Adnexa  Without masses or tenderness    Anus and perineum  Normal   Rectovaginal  Normal sphincter tone without palpated masses or tenderness.    Assessment/Plan:  44 y.o. [id number] female with regular menses, tubal sterilization.   1. Menorrhagia. Patient relates menses  lasting 10 days heavy for the first several days. Does have a history of endometrial polyps. Will start with sonohysterogram to better define the cavity and rule out residual polyps/myomas. 2. Pap smear 12/2013. No Pap smear done today. No history of significant abnormal Pap smears. 3. Mammography never. Strongly recommended baseline mammogram. 4. Health maintenance. No routine blood work done as she has this done through her primary physician's office. Follow up for sonohysterogram as scheduled.   Anastasio Auerbach MD, 12:31 PM 11/07/2014

## 2014-11-07 NOTE — Patient Instructions (Addendum)
Follow up for ultrasound as scheduled.  Call to Schedule your mammogram  Facilities in [place]: 1)  [facility], [place] Phone: [phone] 2)  [facility]. [address] [place] Phone: [phone] 3)  Dr. Isaiah Blakes at [facility] [address] Phone: [phone]     Mammogram A mammogram is an X-ray test to find changes in a woman's breast. You should get a mammogram if:  You are 95 years of age or older  You have risk factors.   Your doctor recommends that you have one.  BEFORE THE TEST  Do not schedule the test the week before your period, especially if your breasts are sore during this time.  On the day of your mammogram:  Wash your breasts and armpits well. After washing, do not put on any deodorant or talcum powder on until after your test.   Eat and drink as you usually do.   Take your medicines as usual.   If you are diabetic and take insulin, make sure you:   Eat before coming for your test.   Take your insulin as usual.   If you cannot keep your appointment, call before the appointment to cancel. Schedule another appointment.  TEST  You will need to undress from the waist up. You will put on a hospital gown.   Your breast will be put on the mammogram machine, and it will press firmly on your breast with a piece of plastic called a compression paddle. This will make your breast flatter so that the machine can X-ray all parts of your breast.   Both breasts will be X-rayed. Each breast will be X-rayed from above and from the side. An X-ray might need to be taken again if the picture is not good enough.   The mammogram will last about 15 to 30 minutes.  AFTER THE TEST Finding out the results of your test Ask when your test results will be ready. Make sure you get your test results.  Document Released: 06/13/2008 Document Revised: 03/06/2011 Document  Reviewed: 06/13/2008 [facility] Patient Information 2012 [place].

## 2014-11-14 ENCOUNTER — Other Ambulatory Visit: Payer: Self-pay | Admitting: Gynecology

## 2014-11-14 DIAGNOSIS — N939 Abnormal uterine and vaginal bleeding, unspecified: Secondary | ICD-10-CM

## 2014-11-30 ENCOUNTER — Telehealth: Payer: Self-pay | Admitting: Gynecology

## 2014-11-30 NOTE — Telephone Encounter (Signed)
11/30/14-I spoke w/pt husband since wife speaks no [place] and he is on DPR to give sonohysterogram benefits. She would [id number]$158.72 for the test since she has only met $25.64 of her $500.00 oop max after which all would be covered at 100%. Per her husband they have no money to even do payment plan so he requested we cancel her appt. His English was limited as well but he did seem to understand when I offered payment plan but denied it.wl

## 2014-12-08 ENCOUNTER — Other Ambulatory Visit: Payer: 59

## 2014-12-08 ENCOUNTER — Ambulatory Visit: Payer: 59 | Admitting: Gynecology

## 2014-12-27 ENCOUNTER — Ambulatory Visit (INDEPENDENT_AMBULATORY_CARE_PROVIDER_SITE_OTHER): Payer: 59 | Admitting: Family Medicine

## 2014-12-27 VITALS — BP 110/60 | HR 61 | Temp 97.1°F | Resp 16 | Ht <= 58 in | Wt 113.0 lb

## 2014-12-27 DIAGNOSIS — D649 Anemia, unspecified: Secondary | ICD-10-CM | POA: Diagnosis not present

## 2014-12-27 DIAGNOSIS — G4489 Other headache syndrome: Secondary | ICD-10-CM

## 2014-12-27 DIAGNOSIS — Z9889 Other specified postprocedural states: Secondary | ICD-10-CM | POA: Diagnosis not present

## 2014-12-27 DIAGNOSIS — Z8742 Personal history of other diseases of the female genital tract: Secondary | ICD-10-CM

## 2014-12-27 DIAGNOSIS — N920 Excessive and frequent menstruation with regular cycle: Secondary | ICD-10-CM | POA: Diagnosis not present

## 2014-12-27 LAB — POCT CBC
Granulocyte percent: 55 %G (ref 37–80)
HCT, POC: 42 % (ref 37.7–47.9)
Hemoglobin: 13.4 g/dL (ref 12.2–16.2)
Lymph, poc: 2.6 (ref 0.6–3.4)
MCH, POC: 30.9 pg (ref 27–31.2)
MCHC: 31.8 g/dL (ref 31.8–35.4)
MCV: 97.3 fL — AB (ref 80–97)
MID (cbc): 0.5 (ref 0–0.9)
MPV: 6.6 fL (ref 0–99.8)
POC Granulocyte: 3.8 (ref 2–6.9)
POC LYMPH PERCENT: 37.3 % (ref 10–50)
POC MID %: 7.7 % (ref 0–12)
Platelet Count, POC: 307 10*3/uL (ref 142–424)
RBC: 4.32 M/uL (ref 4.04–5.48)
RDW, POC: 13.4 %
WBC: 6.9 10*3/uL (ref 4.6–10.2)

## 2014-12-27 NOTE — Progress Notes (Signed)
Chief Complaint:  Chief Complaint  Patient presents with  . Abdominal Pain    x 2 days    HPI: Candace Mccormick is a 44 y.o. female who reports to [facility] today complaining of abdominal pain but she is really here because she has had some menorrhagia, prolong spotting for months now. She has a hx of a cervical cervical polyp s/p removal but since then she has had spotting, She has pink dc and fills up a panty liner daily. She has some dizziness with this and more than anything she has anxiety about it. She has seen Dr Phineas Real and recommended a sonohysterogram but they have a $500 dedductibke and it has been cost prohibitive. Since her last visit with me she called her sister in [place] and asked her what she should do for her menorrhagia and her sister sent over some borth control pills.   She is taking pynestrenol 5 mg  Also levonorgel 125 mcg and also ethinyl estradiol 30 mcg. She has taken only 1-2 pills of each. + headache, no CP or SOB, DOE   Past Medical History  Diagnosis Date  . Cervical polyp   . Anemia    Past Surgical History  Procedure Laterality Date  . Cervical polypectomy    . Cesarean section      x 2 with BTL   Social History   Social History  . Marital Status: Married    Spouse Name: N/A  . Number of Children: N/A  . Years of Education: N/A   Social History Main Topics  . Smoking status: Never Smoker   . Smokeless tobacco: Never Used  . Alcohol Use: No  . Drug Use: No  . Sexual Activity: Yes    Birth Control/ Protection: Surgical     Comment: BTL with repeat C section   Other Topics Concern  . None   Social History Narrative   History reviewed. No pertinent family history. No Known Allergies Prior to Admission medications   Medication Sig Start Date End Date Taking? Authorizing Provider  aspirin 81 MG tablet Take 81 mg by mouth daily.   Yes Historical Provider, MD  ferrous sulfate 325 (65 FE) MG EC tablet Take 1 tablet (325 mg total) by mouth  3 (three) times daily with meals. 01/24/14  Yes Donne Hazel, MD  Multiple Vitamin (MULTIVITAMIN) tablet Take 1 tablet by mouth daily.   Yes Historical Provider, MD  Omega-3 Fatty Acids (FISH OIL PO) Take 1 tablet by mouth every evening.   Yes Historical Provider, MD     ROS: The patient denies fevers, chills, night sweats, unintentional weight loss, chest pain, palpitations, wheezing, dyspnea on exertion, nausea, vomiting, abdominal pain, dysuria, hematuria, melena, numbness, weakness, or tingling.   All other systems have been reviewed and were otherwise negative with the exception of those mentioned in the HPI and as above.    PHYSICAL EXAM: Filed Vitals:   12/27/14 1230  BP: 110/60  Pulse: 61  Temp: 97.1 F (36.2 C)  Resp: 16   Body mass index is 23.62 kg/(m^2).   General: Alert, no acute distress HEENT:  Normocephalic, atraumatic, oropharynx patent. EOMI, PERRLA, conjunctiva is nice and pink , fundoscopic exam normal Cardiovascular:  Regular rate and rhythm, no rubs murmurs or gallops.  No Carotid bruits, radial pulse intact. No pedal edema.  Respiratory: Clear to auscultation bilaterally.  No wheezes, rales, or rhonchi.  No cyanosis, no use of accessory musculature Abdominal: No organomegaly, abdomen  is soft and non-tender, positive bowel sounds. No masses. Skin: No rashes. Neurologic: Facial musculature symmetric. Cn 2-12 grossly normal Psychiatric: Patient acts appropriately throughout our interaction. Lymphatic: No cervical or submandibular lymphadenopathy Musculoskeletal: Gait intact. No edema, tenderness Cervical exam-She has a pinky size red fleshy lesion that is extending out of her cervical os? Polyp or cervical tissue There is minimal active bright red blood but nothing extensive,    LABS: Results for orders placed or performed in visit on 12/27/14  POCT CBC  Result Value Ref Range   WBC 6.9 4.6 - 10.2 K/uL   Lymph, poc 2.6 0.6 - 3.4   POC LYMPH PERCENT 37.3  10 - 50 %L   MID (cbc) 0.5 0 - 0.9   POC MID % 7.7 0 - 12 %M   POC Granulocyte 3.8 2 - 6.9   Granulocyte percent 55.0 37 - 80 %G   RBC 4.32 4.04 - 5.48 M/uL   Hemoglobin 13.4 12.2 - 16.2 g/dL   HCT, POC 42.0 37.7 - 47.9 %   MCV 97.3 (A) 80 - 97 fL   MCH, POC 30.9 27 - 31.2 pg   MCHC 31.8 31.8 - 35.4 g/dL   RDW, POC 13.4 %   Platelet Count, POC 307 142 - 424 K/uL   MPV 6.6 0 - 99.8 fL     EKG/XRAY:   Primary read interpreted by Dr. Marin Comment at [facility].   ASSESSMENT/PLAN: Encounter Diagnoses  Name Primary?  Marland Kitchen Anemia, unspecified anemia type Yes  . Other headache syndrome   . H/O cervical polypectomy   . Menorrhagia with regular cycle    Her anemia is stable, better than 5 when she had to have a transfusion for cervical polyp Not sure if polyp is till present but cervical exam is abnormal ? Polyp vs excess cervical tissues I have advised her to stop taking the different hormone pills she got from [place], followup with Dr Phineas Real for that sonhysterogram to see what is actually happening They will try to meet their deductible.  She is anxious and worried, giving her headaches and some dizziness with this, encourage her to push fluids, cont with iron   Gross sideeffects, risk and benefits, and alternatives of medications d/w patient. Patient is aware that all medications have potential sideeffects and we are unable to predict every sideeffect or drug-drug interaction that may occur.  Thao Le DO  12/30/2014 8:26 AM

## 2014-12-29 ENCOUNTER — Telehealth: Payer: Self-pay | Admitting: Gynecology

## 2014-12-29 NOTE — Telephone Encounter (Signed)
12/29/14-Pt husband came in today to reschedule the pt SHGM. He agreed to pay $100 upfront and then make payments on balance due. I told him that I would recheck his wife's benefits closer to the procedure date to see if any more of her $500 oop has been met yet.wl

## 2014-12-30 DIAGNOSIS — N920 Excessive and frequent menstruation with regular cycle: Secondary | ICD-10-CM | POA: Insufficient documentation

## 2014-12-30 DIAGNOSIS — Z8742 Personal history of other diseases of the female genital tract: Secondary | ICD-10-CM | POA: Insufficient documentation

## 2014-12-30 DIAGNOSIS — Z9889 Other specified postprocedural states: Secondary | ICD-10-CM

## 2015-01-17 ENCOUNTER — Other Ambulatory Visit: Payer: Self-pay | Admitting: Gynecology

## 2015-01-17 ENCOUNTER — Ambulatory Visit (INDEPENDENT_AMBULATORY_CARE_PROVIDER_SITE_OTHER): Payer: 59 | Admitting: Gynecology

## 2015-01-17 ENCOUNTER — Encounter: Payer: Self-pay | Admitting: Gynecology

## 2015-01-17 ENCOUNTER — Ambulatory Visit (INDEPENDENT_AMBULATORY_CARE_PROVIDER_SITE_OTHER): Payer: 59

## 2015-01-17 DIAGNOSIS — M5489 Other dorsalgia: Secondary | ICD-10-CM

## 2015-01-17 DIAGNOSIS — N920 Excessive and frequent menstruation with regular cycle: Secondary | ICD-10-CM | POA: Diagnosis not present

## 2015-01-17 DIAGNOSIS — N939 Abnormal uterine and vaginal bleeding, unspecified: Secondary | ICD-10-CM

## 2015-01-17 DIAGNOSIS — D252 Subserosal leiomyoma of uterus: Secondary | ICD-10-CM | POA: Diagnosis not present

## 2015-01-17 DIAGNOSIS — N9489 Other specified conditions associated with female genital organs and menstrual cycle: Secondary | ICD-10-CM | POA: Diagnosis not present

## 2015-01-17 DIAGNOSIS — N831 Corpus luteum cyst of ovary, unspecified side: Secondary | ICD-10-CM | POA: Diagnosis not present

## 2015-01-17 LAB — URINALYSIS W MICROSCOPIC + REFLEX CULTURE
BILIRUBIN URINE: NEGATIVE
CASTS: NONE SEEN [LPF]
Crystals: NONE SEEN [HPF]
Glucose, UA: NEGATIVE
KETONES UR: NEGATIVE
Leukocytes, UA: NEGATIVE
Nitrite: NEGATIVE
PH: 6.5 (ref 5.0–8.0)
Protein, ur: NEGATIVE
Specific Gravity, Urine: 1.01 (ref 1.001–1.035)
YEAST: NONE SEEN [HPF]

## 2015-01-17 NOTE — Progress Notes (Signed)
Candace Mccormick [birth year] [id number]        44 y.o.  [id number] Presents for sonohysterogram due to history of menorrhagia. Had polyp removed from her cervix 12/2013 with suggestion of lower uterine segment polyp on ultrasound. Patient and husband presents for the study with interpretation via the phone with Saralyn Pilar a translator provided by [facility].  Past medical history,surgical history, problem list, medications, allergies, family history and social history were all reviewed and documented in the EPIC chart.  Directed ROS with pertinent positives and negatives documented in the history of present illness/assessment and plan.  Exam: [place] assistant There were no vitals filed for this visit. General appearance:  Normal External beam [place] vagina with slight staining. Cervix normal. Uterus normal size midline mobile nontender. 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 Endometrial echo 14 mm. Right and left ovaries grossly normal with physiologic changes. Cul-de-sac with 41 x 31 mm fluid.  Sonohysterogram performed, sterile technique, easy catheter introduction, good distention with large fingerlike polyp 58 x 9 x 13 mm extending from the fundus to the cervical canal.  Endometrial sample taken. Patient tolerated well  Assessment/Plan:  44 y.o. [id number] with large fingerlike endometrial polyp. I reviewed the findings with the patient and her husband through the interpreter Saralyn Pilar. I discussed the need to proceed with hysteroscopy D&C and removal of the polyp. I discussed in general which involved with the procedure and she was instructed that our office will contact her to set up a preoperative consult for which she again will have an interpreter and will go through the procedure in detail. The patient and her husband both agree with this.  Patient also had some complaints of low back pain and a urinalysis was run which showed 10-20  RBCs 0-5 WBC with 6-10 squamous cells. I suspect this is a vaginal contaminant as she was bleeding at the time of the exam. Will follow up with culture and treat if needed. Otherwise her exam was normal to include a normal abdominal and pelvic exam. She'll follow up if this pain continues    Anastasio Auerbach MD, 11:01 AM 01/17/2015

## 2015-01-17 NOTE — Patient Instructions (Signed)
Office will contact you to arrange surgery for the endometrial polyp.

## 2015-01-18 ENCOUNTER — Telehealth: Payer: Self-pay

## 2015-01-18 LAB — URINE CULTURE
COLONY COUNT: NO GROWTH
ORGANISM ID, BACTERIA: NO GROWTH

## 2015-01-18 NOTE — Telephone Encounter (Signed)
I called the [place] translation service and was assisted by Mallie Mussel ID# [id number].  He called patient at both home and cell phone numbers and no answer at either and no voice mail box has been set up at either.  I will send her a letter and ask her to call.

## 2015-01-19 ENCOUNTER — Telehealth: Payer: Self-pay

## 2015-01-19 NOTE — Telephone Encounter (Signed)
I used the interpretor service and he called both of patient's phone numbers. No answer and no voice mail set up.

## 2015-01-22 ENCOUNTER — Telehealth: Payer: Self-pay

## 2015-01-22 NOTE — Telephone Encounter (Signed)
Patient's husband Ket stopped by the office with patient's AVS and was confused what it was and what he needed to do with it.  I also needed to talk with him about her scheduled surgery. I called the [place] interpretor and spoke with Pilar Plate [phone].  He explained to [facility] about AVS and that it was for his records. I relayed the surgery info but the time of day did not work for him. He has to work and cannot miss work.  He requests 9 or 10 in the morning for the surgery and 4:00pm for the consult.  I told him to let me look at it and see what I can figure out and I will call him through interpretor line.  I have previously not been able to reach him by phone and suggested he might want to stop by end of next week. He assured me he will have ringer on this time and he will hear phone and answer my call.

## 2015-02-02 ENCOUNTER — Telehealth: Payer: Self-pay

## 2015-02-02 NOTE — Telephone Encounter (Signed)
I called through [place] and spoke with patient's husband Ket.  I relayed the surgery and pre op cons with Dr. Loetta Rough dates/times to him. He asked me to send them in writing and that has been done. I let Dr. Loetta Rough know that I did not relay to husband about Cytotec tab since patient will be coming in prior to surgery to talk with Dr. Loetta Rough and will have interpretor with her.

## 2015-02-03 ENCOUNTER — Encounter (HOSPITAL_COMMUNITY)
Admission: EM | Disposition: A | Discharge status: Home / Self Care | Payer: Self-pay | Source: Home / Self Care | Attending: Emergency Medicine

## 2015-02-03 ENCOUNTER — Inpatient Hospital Stay (HOSPITAL_COMMUNITY): Payer: 59 | Admitting: Anesthesiology

## 2015-02-03 ENCOUNTER — Encounter (HOSPITAL_COMMUNITY): Payer: Self-pay | Admitting: Emergency Medicine

## 2015-02-03 ENCOUNTER — Ambulatory Visit (HOSPITAL_COMMUNITY)
Admission: EM | Admit: 2015-02-03 | Discharge: 2015-02-03 | Disposition: A | Discharge status: Home / Self Care | Payer: 59 | Attending: Gynecology | Admitting: Gynecology

## 2015-02-03 ENCOUNTER — Emergency Department (HOSPITAL_COMMUNITY): Payer: 59

## 2015-02-03 DIAGNOSIS — N920 Excessive and frequent menstruation with regular cycle: Secondary | ICD-10-CM | POA: Diagnosis not present

## 2015-02-03 DIAGNOSIS — R58 Hemorrhage, not elsewhere classified: Secondary | ICD-10-CM | POA: Diagnosis not present

## 2015-02-03 DIAGNOSIS — Z7982 Long term (current) use of aspirin: Secondary | ICD-10-CM | POA: Diagnosis not present

## 2015-02-03 DIAGNOSIS — N840 Polyp of corpus uteri: Secondary | ICD-10-CM | POA: Insufficient documentation

## 2015-02-03 DIAGNOSIS — D649 Anemia, unspecified: Secondary | ICD-10-CM | POA: Diagnosis not present

## 2015-02-03 DIAGNOSIS — N939 Abnormal uterine and vaginal bleeding, unspecified: Secondary | ICD-10-CM

## 2015-02-03 HISTORY — PX: DILATATION & CURETTAGE/HYSTEROSCOPY WITH MYOSURE: SHX6511

## 2015-02-03 LAB — COMPREHENSIVE METABOLIC PANEL
ALT: 24 U/L (ref 14–54)
AST: 21 U/L (ref 15–41)
Albumin: 3.6 g/dL (ref 3.5–5.0)
Alkaline Phosphatase: 43 U/L (ref 38–126)
Anion gap: 3 — ABNORMAL LOW (ref 5–15)
BILIRUBIN TOTAL: 0.3 mg/dL (ref 0.3–1.2)
BUN: 10 mg/dL (ref 6–20)
CO2: 28 mmol/L (ref 22–32)
CREATININE: 0.55 mg/dL (ref 0.44–1.00)
Calcium: 8.7 mg/dL — ABNORMAL LOW (ref 8.9–10.3)
Chloride: 107 mmol/L (ref 101–111)
GFR calc Af Amer: >60 mL/min (ref >60)
Glucose, Bld: 95 mg/dL (ref 65–99)
POTASSIUM: 4.4 mmol/L (ref 3.5–5.1)
Sodium: 138 mmol/L (ref 135–145)
TOTAL PROTEIN: 6.1 g/dL — AB (ref 6.5–8.1)

## 2015-02-03 LAB — LIPASE, BLOOD: LIPASE: 32 U/L (ref 11–51)

## 2015-02-03 LAB — URINALYSIS, ROUTINE W REFLEX MICROSCOPIC
BILIRUBIN URINE: NEGATIVE
GLUCOSE, UA: NEGATIVE mg/dL
KETONES UR: 15 mg/dL — AB
NITRITE: NEGATIVE
PH: 6 (ref 5.0–8.0)
PROTEIN: 30 mg/dL — AB
Specific Gravity, Urine: 1.015 (ref 1.005–1.030)
Urobilinogen, UA: 0.2 mg/dL (ref 0.0–1.0)

## 2015-02-03 LAB — I-STAT CHEM 8, ED
BUN: 11 mg/dL (ref 6–20)
CALCIUM ION: 1.25 mmol/L — AB (ref 1.12–1.23)
CREATININE: 0.6 mg/dL (ref 0.44–1.00)
Chloride: 104 mmol/L (ref 101–111)
GLUCOSE: 92 mg/dL (ref 65–99)
HCT: 33 % — ABNORMAL LOW (ref 36.0–46.0)
HEMOGLOBIN: 11.2 g/dL — AB (ref 12.0–15.0)
Potassium: 4.3 mmol/L (ref 3.5–5.1)
Sodium: 139 mmol/L (ref 135–145)
TCO2: 25 mmol/L (ref 0–100)

## 2015-02-03 LAB — CBC
HEMATOCRIT: 32.2 % — AB (ref 36.0–46.0)
Hemoglobin: 11 g/dL — ABNORMAL LOW (ref 12.0–15.0)
MCH: 32.7 pg (ref 26.0–34.0)
MCHC: 34.2 g/dL (ref 30.0–36.0)
MCV: 95.8 fL (ref 78.0–100.0)
PLATELETS: 270 10*3/uL (ref 150–400)
RBC: 3.36 MIL/uL — ABNORMAL LOW (ref 3.87–5.11)
RDW: 11.9 % (ref 11.5–15.5)
WBC: 7.8 10*3/uL (ref 4.0–10.5)

## 2015-02-03 LAB — I-STAT BETA HCG BLOOD, ED (MC, WL, AP ONLY)

## 2015-02-03 LAB — HEMOGLOBIN AND HEMATOCRIT, BLOOD
HCT: 28.7 % — ABNORMAL LOW (ref 36.0–46.0)
Hemoglobin: 9.7 g/dL — ABNORMAL LOW (ref 12.0–15.0)

## 2015-02-03 LAB — TYPE AND SCREEN
ABO/RH(D): O POS
ANTIBODY SCREEN: NEGATIVE

## 2015-02-03 LAB — URINE MICROSCOPIC-ADD ON

## 2015-02-03 SURGERY — DILATATION & CURETTAGE/HYSTEROSCOPY WITH MYOSURE
Anesthesia: General | Site: Uterus

## 2015-02-03 MED ORDER — CITRIC ACID-SODIUM CITRATE 334-500 MG/5ML PO SOLN
30.0000 mL | Freq: Once | ORAL | Status: AC
  Administered 2015-02-03: 30 mL via ORAL
  Filled 2015-02-03: qty 15

## 2015-02-03 MED ORDER — SODIUM CHLORIDE 0.9 % IV SOLN
INTRAVENOUS | Status: DC
  Administered 2015-02-03: 12:00:00 via INTRAVENOUS

## 2015-02-03 MED ORDER — METOCLOPRAMIDE HCL 10 MG PO TABS: 10.0000 mg | ORAL_TABLET | Freq: Three times a day (TID) | ORAL | Status: DC

## 2015-02-03 MED ORDER — ONDANSETRON HCL 4 MG/2ML IJ SOLN
4.0000 mg | Freq: Once | INTRAMUSCULAR | Status: AC
  Administered 2015-02-03: 4 mg via INTRAVENOUS
  Filled 2015-02-03: qty 2

## 2015-02-03 MED ORDER — KETOROLAC TROMETHAMINE 30 MG/ML IJ SOLN
INTRAMUSCULAR | Status: DC | PRN
  Administered 2015-02-03: 30 mg via INTRAVENOUS

## 2015-02-03 MED ORDER — FENTANYL CITRATE (PF) 100 MCG/2ML IJ SOLN
INTRAMUSCULAR | Status: DC | PRN
  Administered 2015-02-03 (×2): 50 ug via INTRAVENOUS

## 2015-02-03 MED ORDER — SODIUM CHLORIDE 0.9 % IV SOLN: INTRAVENOUS | Status: DC

## 2015-02-03 MED ORDER — IOHEXOL 300 MG/ML  SOLN
80.0000 mL | Freq: Once | INTRAMUSCULAR | Status: AC | PRN
  Administered 2015-02-03: 100 mL via INTRAVENOUS

## 2015-02-03 MED ORDER — PROPOFOL 10 MG/ML IV BOLUS
INTRAVENOUS | Status: DC | PRN
  Administered 2015-02-03: 120 mg via INTRAVENOUS

## 2015-02-03 MED ORDER — FAMOTIDINE IN NACL 20-0.9 MG/50ML-% IV SOLN
20.0000 mg | Freq: Once | INTRAVENOUS | Status: AC
  Administered 2015-02-03: 20 mg via INTRAVENOUS
  Filled 2015-02-03: qty 50

## 2015-02-03 MED ORDER — CEFAZOLIN SODIUM 1-5 GM-% IV SOLN
1.0000 g | INTRAVENOUS | Status: AC
  Administered 2015-02-03: 1 g via INTRAVENOUS
  Filled 2015-02-03: qty 50

## 2015-02-03 MED ORDER — FENTANYL CITRATE (PF) 100 MCG/2ML IJ SOLN: 25.0000 ug | INTRAMUSCULAR | Status: DC | PRN

## 2015-02-03 MED ORDER — SODIUM CHLORIDE 0.9 % IV BOLUS (SEPSIS)
500.0000 mL | Freq: Once | INTRAVENOUS | Status: AC
  Administered 2015-02-03: 500 mL via INTRAVENOUS

## 2015-02-03 MED ORDER — SODIUM CHLORIDE 0.9 % IV BOLUS (SEPSIS)
250.0000 mL | Freq: Once | INTRAVENOUS | Status: AC
  Administered 2015-02-03: 250 mL via INTRAVENOUS

## 2015-02-03 MED ORDER — DEXAMETHASONE SODIUM PHOSPHATE 10 MG/ML IJ SOLN
INTRAMUSCULAR | Status: DC | PRN
  Administered 2015-02-03: 10 mg via INTRAVENOUS

## 2015-02-03 MED ORDER — LACTATED RINGERS IV BOLUS (SEPSIS): 1000.0000 mL | Freq: Once | INTRAVENOUS | Status: DC

## 2015-02-03 MED ORDER — MEGESTROL ACETATE 40 MG PO TABS: 40.0000 mg | ORAL_TABLET | Freq: Three times a day (TID) | ORAL | Status: DC

## 2015-02-03 MED ORDER — SODIUM CHLORIDE 0.9 % IV SOLN
INTRAVENOUS | Status: DC
  Administered 2015-02-03: 13:00:00 via INTRAVENOUS

## 2015-02-03 MED ORDER — EPHEDRINE SULFATE 50 MG/ML IJ SOLN
INTRAMUSCULAR | Status: DC | PRN
Start: 2015-02-03 — End: 2015-02-03
  Administered 2015-02-03: 10 mg via INTRAVENOUS

## 2015-02-03 MED ORDER — METOCLOPRAMIDE HCL 5 MG/ML IJ SOLN: 10.0000 mg | Freq: Once | INTRAMUSCULAR | Status: DC | PRN

## 2015-02-03 MED ORDER — MEGESTROL ACETATE 40 MG PO TABS: 40.0000 mg | ORAL_TABLET | Freq: Two times a day (BID) | ORAL | Status: DC

## 2015-02-03 MED ORDER — FERROUS SULFATE 325 (65 FE) MG PO TABS: 325.0000 mg | ORAL_TABLET | Freq: Every day | ORAL | Status: DC

## 2015-02-03 MED ORDER — MIDAZOLAM HCL 5 MG/5ML IJ SOLN
INTRAMUSCULAR | Status: DC | PRN
  Administered 2015-02-03: 2 mg via INTRAVENOUS

## 2015-02-03 MED ORDER — LACTATED RINGERS IV SOLN
INTRAVENOUS | Status: DC
  Administered 2015-02-03 (×3): via INTRAVENOUS

## 2015-02-03 MED ORDER — LIDOCAINE HCL (CARDIAC) 20 MG/ML IV SOLN
INTRAVENOUS | Status: DC | PRN
  Administered 2015-02-03: 40 mg via INTRAVENOUS

## 2015-02-03 MED ORDER — MEPERIDINE HCL 25 MG/ML IJ SOLN: 6.2500 mg | INTRAMUSCULAR | Status: DC | PRN

## 2015-02-03 SURGICAL SUPPLY — 22 items
CANISTERS HI-FLOW 3000CC (CANNISTER) ×3 IMPLANT
CATH FOLEY 2WAY SLVR 30CC 16FR (CATHETERS) IMPLANT
CATH ROBINSON RED A/P 16FR (CATHETERS) ×3 IMPLANT
CLOTH BEACON ORANGE TIMEOUT ST (SAFETY) ×3 IMPLANT
CONTAINER PREFILL 10% NBF 60ML (FORM) ×6 IMPLANT
DEVICE MYOSURE LITE (MISCELLANEOUS) ×3 IMPLANT
DEVICE MYOSURE REACH (MISCELLANEOUS) IMPLANT
FILTER ARTHROSCOPY CONVERTOR (FILTER) ×3 IMPLANT
GLOVE BIOGEL PI IND STRL 8 (GLOVE) ×1 IMPLANT
GLOVE BIOGEL PI INDICATOR 8 (GLOVE) ×2
GLOVE ECLIPSE 7.5 STRL STRAW (GLOVE) ×6 IMPLANT
GOWN STRL REUS W/TWL LRG LVL3 (GOWN DISPOSABLE) ×6 IMPLANT
PACK VAGINAL MINOR WOMEN LF (CUSTOM PROCEDURE TRAY) ×3 IMPLANT
PAD OB MATERNITY 4.3X12.25 (PERSONAL CARE ITEMS) ×3 IMPLANT
PAD PREP 24X48 CUFFED NSTRL (MISCELLANEOUS) ×3 IMPLANT
PLUG CATH AND CAP STER (CATHETERS) IMPLANT
SEAL ROD LENS SCOPE MYOSURE (ABLATOR) ×3 IMPLANT
SYR 30ML LL (SYRINGE) IMPLANT
TOWEL OR 17X24 6PK STRL BLUE (TOWEL DISPOSABLE) ×6 IMPLANT
TUBING AQUILEX INFLOW (TUBING) ×3 IMPLANT
TUBING AQUILEX OUTFLOW (TUBING) ×3 IMPLANT
WATER STERILE IRR 1000ML POUR (IV SOLUTION) ×3 IMPLANT

## 2015-02-03 NOTE — ED Notes (Signed)
Patient returned from CT

## 2015-02-03 NOTE — Anesthesia Preprocedure Evaluation (Signed)
Anesthesia Evaluation  Patient identified by MRN, date of birth, ID band Patient awake    Reviewed: Allergy & Precautions  Airway Mallampati: II  TM Distance: >3 FB Neck ROM: Full    Dental no notable dental hx. (+) Teeth Intact   Pulmonary neg pulmonary ROS,    Pulmonary exam normal breath sounds clear to auscultation       Cardiovascular negative cardio ROS Normal cardiovascular exam Rhythm:Regular Rate:Normal     Neuro/Psych negative neurological ROS  negative psych ROS   GI/Hepatic negative GI ROS, Neg liver ROS,   Endo/Other  negative endocrine ROS  Renal/GU negative Renal ROS  negative genitourinary   Musculoskeletal negative musculoskeletal ROS (+)   Abdominal   Peds  Hematology  (+) anemia ,   Anesthesia Other Findings   Reproductive/Obstetrics DUB Myoma                             Anesthesia Physical Anesthesia Plan  ASA: II  Anesthesia Plan: General   Post-op Pain Management:    Induction: Intravenous  Airway Management Planned: LMA  Additional Equipment:   Intra-op Plan:   Post-operative Plan: Extubation in OR  Informed Consent: I have reviewed the patients History and Physical, chart, labs and discussed the procedure including the risks, benefits and alternatives for the proposed anesthesia with the patient or authorized representative who has indicated his/her understanding and acceptance.   Dental advisory given  Plan Discussed with: CRNA, Anesthesiologist and Surgeon  Anesthesia Plan Comments:         Anesthesia Quick Evaluation

## 2015-02-03 NOTE — Transfer of Care (Signed)
Immediate Anesthesia Transfer of Care Note  Patient: Candace Mccormick  Procedure(s) Performed: Procedure(s): DILATATION & CURETTAGE/HYSTEROSCOPY WITH MYOSURE (N/A)  Patient Location: PACU  Anesthesia Type:General  Level of Consciousness: sedated  Airway & Oxygen Therapy: Patient Spontanous Breathing and Patient connected to nasal cannula oxygen  Post-op Assessment: Report given to RN and Post -op Vital signs reviewed and stable  Post vital signs: stable  Last Vitals:  Filed Vitals:   02/03/15 1826  BP: 97/47  Pulse: 53  Temp: 36.4 C  Resp: 17    Complications: No apparent anesthesia complications

## 2015-02-03 NOTE — Op Note (Signed)
   Operative Note  02/03/2015  8:26 PM  PATIENT:  Candace Mccormick  44 y.o. female  PRE-OPERATIVE DIAGNOSIS:  dysfunctional vaginal bleeding, prolapsing uterine polyp, hemorrhage, anemia  POST-OPERATIVE DIAGNOSIS: same  PROCEDURE:  Procedure(s): DILATATION & CURETTAGE/HYSTEROSCOPY WITH MYOSURE resectoscopic polypectomy  SURGEON:  Surgeon(s): Terrance Mass, MD  ANESTHESIA:   general  FINDINGS:  DESCRIPTION OF OPERATION:FINDINGS: The patient was taken to the operating room where she underwent a successful general endotracheal anesthesia. Patient had PAS stockings for DVT prophylaxis. She received 2 g of Ancef preop. Time out was undertaken to properly identify the patient and the proper operation schedule. The vagina and perineum were prepped and draped in usual sterile fashion. A red rubber Quentin Cornwall was inserted to empty the bladder is content for approximately 50 cc. Bimanual examination demonstrated an anteverted uterus. Patient's legs were in the high lithotomy position. A weighted speculum was placed in the posterior vaginal vault. A single-tooth tenaculum was placed on the anterior cervical lip. The uterus sounded to 7-1/2 cm. Pratt dilator were used to dilate the cervical canal to a  21 mm size. The Hologic Myosure resectoscopic morcellator with a scope of 6.25 mm and an operating blade of 3.0 mm was introduced into the intrauterine cavity. 0.9% normal saline was the distending media.a prolapsed endometrial polyp was noted to extend beyond the external cervical os. Inspection of the uterine cavity demonstrated that the stalk was long and attached to the fundal right lateral sidewall of the uterus.. With the resectoscopic morcellator the entire polypoid mass was resected completely and submitted for histological evaluation. Fluid deficit was approximately400 cc.  The single-tooth tenaculum was removed patient was extubated transferred to recovery room stable vital signs blood loss was  minimal. She received 30 mg of Toradol in route to the recovery room.    ESTIMATED BLOOD LOSS minimal   Intake/Output Summary (Last 24 hours) at 02/03/15 2026 Last data filed at 02/03/15 2009  Gross per 24 hour  Intake    500 ml  Output     50 ml  Net    450 ml     BLOOD ADMINISTERED:none   LOCAL MEDICATIONS USED:  Pitressin 20 units in 30 cc of saline as a dilution. Total 20 cc were infiltrated at the ectocervix 2, 4, 8 and 10:00 position  SPECIMEN:  Source of Specimen:  Prolapsed uterine polyp  DISPOSITION OF SPECIMEN:  PATHOLOGY  COUNTS:  YES  PLAN OF CARE: Transfer to [facility] HMD8:26 PMTD@

## 2015-02-03 NOTE — ED Notes (Signed)
Husands /translator stated, stomach pain, a lot more bleeding this month.

## 2015-02-03 NOTE — H&P (Signed)
Preop Exam/and review and medical record, labs and images:  Patient's a 44 year old gravida 4 para 3 Ab1 (2 prior cesarean sections and tubal ligation presented to the emergency room this afternoon complaining of tenderness and heavy vaginal bleeding. Review of patient's record and indicated that back in October 19 she was seen by my partner Dr. Earley Favor as a result of her menorrhagia and underwent a sonohysterogram and endometrial biopsy which demonstrated the following:  Ultrasound shows uterus generous in size with 24 mm subserosal myoma. Endometrial echo 14 mm. Right and left ovaries grossly normal with physiologic changes. Cul-de-sac with 41 x 31 mm fluid.  Sonohysterogram performed, sterile technique, easy catheter introduction, good distention with large fingerlike polyp 58 x 9 x 13 mm extending from the fundus to the cervical canal. Endometrial sample taken.  Pathology report demonstrated the following: Diagnosis Endometrium, biopsy, uterus - EARLY SECRETORY PHASE ENDOMETRIUM. - NO HYPERPLASIA, ATYPIA, OR MALIGNANCY IDENTIFIED.  In the emergency room when she came in this afternoon her hemoglobin was 11.2 and later this afternoon it had dropped to 9.7 she was otherwise hemodynamically stable. Because some abdominal cramping that she was having the emergency room physician ordered CT scan whereby the following was reported:  IMPRESSION: 1. Large pedunculated endometrial polyp which extends a length of approximately 7.4 cm, and is partially prolapsing out the cervical os. There is fluid in the endometrial canal and vagina, which appears to be blood. OB-Gyn referral for polypectomy is recommended. 2. 2.2 cm heterogeneously enhancing lesion in the right side of the uterine body is most likely to represent a small fibroid. 3. No other acute findings in the abdomen or pelvis. 4. Normal appendix.  Past Medical History  Diagnosis Date  . Cervical polyp   . Anemia    Past  Surgical History  Procedure Laterality Date  . Cervical polypectomy    . Cesarean section      x 2 with BTL   No family history on file. Social History  Substance Use Topics  . Smoking status: Never Smoker   . Smokeless tobacco: Never Used  . Alcohol Use: No   OB History    Gravida Para Term Preterm AB TAB SAB Ectopic Multiple Living   4 3 3  1  1   3      Review of Systems  Constitutional: Negative for fever.  HENT: Negative for congestion.  Eyes: Negative for redness.  Respiratory: Negative for shortness of breath.  Cardiovascular: Negative for chest pain.  Gastrointestinal: Positive for abdominal pain.  Genitourinary: Positive for vaginal bleeding.  Musculoskeletal: Positive for back pain.  Skin: Negative for rash.  Neurological: Negative for light-headedness.  Psychiatric/Behavioral: Negative for confusion.        Allergies  Review of patient's allergies indicates no known allergies.  Home Medications   Prior to Admission medications   Medication Sig Start Date End Date Taking? Authorizing Provider  aspirin 81 MG tablet Take 81 mg by mouth daily.   Yes Historical Provider, MD  ferrous sulfate 325 (65 FE) MG EC tablet Take 1 tablet (325 mg total) by mouth 3 (three) times daily with meals. 01/24/14  Yes Donne Hazel, MD  ibuprofen (ADVIL,MOTRIN) 200 MG tablet Take 600 mg by mouth every 4 (four) hours as needed (pain).   Yes Historical Provider, MD  Multiple Vitamin (MULTIVITAMIN) tablet Take 1 tablet by mouth daily.   Yes Historical Provider, MD  ferrous sulfate 325 (65 FE) MG tablet Take 1 tablet (325 mg total)  by mouth daily. 02/03/15   Fredia Sorrow, MD  megestrol (MEGACE) 40 MG tablet Take 1 tablet (40 mg total) by mouth 3 (three) times daily. For 3 days. 02/03/15   Fredia Sorrow, MD  megestrol (MEGACE) 40 MG tablet Take 1 tablet (40 mg total) by mouth 2 (two)  times daily. Until            Examination by the ED physician as follows: BP 90/23 mmHg  Pulse 54  Temp(Src) 98.3 F (36.8 C) (Oral)  Resp 16  Ht 4\' 7"  (1.397 m)  Wt 115 lb (52.164 kg)  BMI 26.73 kg/m2  SpO2 100%  LMP 01/28/2015 Physical Exam  Constitutional: She is oriented to person, place, and time. She appears well-developed and well-nourished. No distress.  HENT:  Head: Normocephalic and atraumatic.  Mouth/Throat: Oropharynx is clear and moist.  Eyes: Conjunctivae are normal. Pupils are equal, round, and reactive to light.  Neck: Normal range of motion.  Cardiovascular: Normal rate, regular rhythm and normal heart sounds.  No murmur heard. Pulmonary/Chest: Effort normal and breath sounds normal. No respiratory distress.  Abdominal: Soft. Bowel sounds are normal. She exhibits no distension. There is no tenderness.  Genitourinary:  Vaginal blood some clots.  Musculoskeletal: Normal range of motion.  Neurological: She is alert and oriented to person, place, and time. No cranial nerve deficit. She exhibits normal muscle tone. Coordination normal.   [facility] Blood pressure 97/47 patient runs low blood pressures. Pulse 60, respiration 18, temperature 97.6 Abdomen: Soft nontender no rebound or guarding Pelvic: Bartholin urethra Skene was within normal limits Vagina with blood clots present in the vaginal probe Cervix: Prolapsed submucous polyp versus myoma Uterus: Anteverted upper limits of normal Adnexa: No palpable masses or tenderness Rectal exam: Not done  Assessment/plan: 44 year old patient with prolapsing submucous myoma versus polyp contributing to her menorrhagia and anemia. Patient hemodynamically stable hemoglobin had been 11.2 at approximately noon today and at 1622 hrs. today had dropped to 9.7. Patient be taken to the operating room to undergo resectoscopic polypectomy/myomectomy/hysteroscopy. The following risks were discussed with  patient and husband:                        Patient was counseled as to the risk of surgery to include the following:  1. Infection (prohylactic antibiotics will be administered)  2. DVT/Pulmonary Embolism (prophylactic pneumo compression stockings will be used)  3.Trauma to internal organs requiring additional surgical procedure to repair any injury to     Internal organs requiring perhaps additional hospitalization days.  4.Hemmorhage requiring transfusion and blood products which carry risks such as             anaphylactic reaction, hepatitis and AIDS  Patient had received literature information on the procedure scheduled and all her questions were answered and fully accepts all risk.   Jye Fariss HMD7:09 PMTD@Note :

## 2015-02-03 NOTE — ED Notes (Signed)
[facility] has been called; was told to call back in 5-10 mins.

## 2015-02-03 NOTE — MAU Note (Signed)
Sent from [facility] for surgery Dr. Toney Rakes. V/SS

## 2015-02-03 NOTE — Discharge Instructions (Signed)
Start taking the iron tablet twice a day. Take the Megace 40 mg 1 pill 3 times a day for 3 days then 40 mg twice a day for the next 11 days.  Call Dr. Zelphia Cairo office on Monday with an interpreter that can speaking English after 9 in the morning at [phone] to arrange the removal of the polyp that is causing the bleeding.  Return for any new or worse symptoms.  DISCHARGE INSTRUCTIONS: HYSTEROSCOPY / ENDOMETRIAL ABLATION The following instructions have been prepared to help you care for yourself upon your return home.  May take Ibuprofen after 2:15 A.M.  Personal hygiene:  Use sanitary pads for vaginal drainage, not tampons.  Shower the day after your procedure.  NO tub baths, pools or Jacuzzis for 2-3 weeks.  Wipe front to back after using the bathroom.  Activity and limitations:  Do NOT drive or operate any equipment for 24 hours. The effects of anesthesia are still present and drowsiness may result.  Do NOT rest in bed all day.  Walking is encouraged.  Walk up and down stairs slowly.  You may resume your normal activity in one to two days or as indicated by your physician.  Sexual activity: NO intercourse for at least 2 weeks after the procedure, or as indicated by your Doctor.  Diet: Eat a light meal as desired this evening. You may resume your usual diet tomorrow.  Return to Work: You may resume your work activities in one to two days or as indicated by Marine scientist.  What to expect after your surgery: Expect to have vaginal bleeding/discharge for 2-3 days and spotting for up to 10 days. It is not unusual to have soreness for up to 1-2 weeks. You may have a slight burning sensation when you urinate for the first day. Mild cramps may continue for a couple of days. You may have a regular period in 2-6 weeks.  Call your doctor for any of the following:  Excessive vaginal bleeding or clotting, saturating and changing one pad every hour.  Inability to urinate 6  hours after discharge from hospital.  Pain not relieved by pain medication.  Fever of 100.4 F or greater.  Unusual vaginal discharge or odor.

## 2015-02-03 NOTE — Anesthesia Procedure Notes (Signed)
Procedure Name: LMA Insertion Date/Time: 02/03/2015 7:43 PM Performed by: Barkley Boards L Pre-anesthesia Checklist: Patient identified, Emergency Drugs available, Suction available and Patient being monitored Patient Re-evaluated:Patient Re-evaluated prior to inductionOxygen Delivery Method: Circle system utilized Preoxygenation: Pre-oxygenation with 100% oxygen Intubation Type: IV induction Ventilation: Mask ventilation without difficulty LMA: LMA inserted LMA Size: 4.0 Number of attempts: 1 Placement Confirmation: positive ETCO2,  CO2 detector and breath sounds checked- equal and bilateral Tube secured with: Tape Dental Injury: Teeth and Oropharynx as per pre-operative assessment

## 2015-02-03 NOTE — ED Provider Notes (Addendum)
CSN: [id number]     Arrival date & time 02/03/15  4098 History   First MD Initiated Contact with Patient 02/03/15 1035     Chief Complaint  Patient presents with  . Abdominal Pain  . Vaginal Bleeding     (Consider location/radiation/quality/duration/timing/severity/associated sxs/prior Treatment) Patient is a 44 y.o. female presenting with abdominal pain and vaginal bleeding. The history is provided by the patient and the spouse.  Abdominal Pain Associated symptoms: vaginal bleeding   Associated symptoms: no chest pain, no fever and no shortness of breath   Vaginal Bleeding Associated symptoms: abdominal pain and back pain   Associated symptoms: no fever    patient presents with about 1 month of vaginal bleeding but it's been heavier in the last 24 hours. Patient also with complaint of pain on bilateral flank lower back area radiating around to the front. Patient denies any lightheadedness or feeling like she got pass out no nausea no vomiting. Pain is not severe. Rated about 5 out of 10.  Past Medical History  Diagnosis Date  . Cervical polyp   . Anemia    Past Surgical History  Procedure Laterality Date  . Cervical polypectomy    . Cesarean section      x 2 with BTL   No family history on file. Social History  Substance Use Topics  . Smoking status: Never Smoker   . Smokeless tobacco: Never Used  . Alcohol Use: No   OB History    Gravida Para Term Preterm AB TAB SAB Ectopic Multiple Living   4 3 3  1  1   3      Review of Systems  Constitutional: Negative for fever.  HENT: Negative for congestion.   Eyes: Negative for redness.  Respiratory: Negative for shortness of breath.   Cardiovascular: Negative for chest pain.  Gastrointestinal: Positive for abdominal pain.  Genitourinary: Positive for vaginal bleeding.  Musculoskeletal: Positive for back pain.  Skin: Negative for rash.  Neurological: Negative for light-headedness.  Psychiatric/Behavioral: Negative for  confusion.      Allergies  Review of patient's allergies indicates no known allergies.  Home Medications   Prior to Admission medications   Medication Sig Start Date End Date Taking? Authorizing Provider  aspirin 81 MG tablet Take 81 mg by mouth daily.   Yes Historical Provider, MD  ferrous sulfate 325 (65 FE) MG EC tablet Take 1 tablet (325 mg total) by mouth 3 (three) times daily with meals. 01/24/14  Yes Donne Hazel, MD  ibuprofen (ADVIL,MOTRIN) 200 MG tablet Take 600 mg by mouth every 4 (four) hours as needed (pain).   Yes Historical Provider, MD  Multiple Vitamin (MULTIVITAMIN) tablet Take 1 tablet by mouth daily.   Yes Historical Provider, MD  ferrous sulfate 325 (65 FE) MG tablet Take 1 tablet (325 mg total) by mouth daily. 02/03/15   Fredia Sorrow, MD  megestrol (MEGACE) 40 MG tablet Take 1 tablet (40 mg total) by mouth 3 (three) times daily. For 3 days. 02/03/15   Fredia Sorrow, MD  megestrol (MEGACE) 40 MG tablet Take 1 tablet (40 mg total) by mouth 2 (two) times daily. Until finished 02/03/15   Fredia Sorrow, MD   BP 90/23 mmHg  Pulse 54  Temp(Src) 98.3 F (36.8 C) (Oral)  Resp 16  Ht 4\' 7"  (1.397 m)  Wt 115 lb (52.164 kg)  BMI 26.73 kg/m2  SpO2 100%  LMP 01/28/2015 Physical Exam  Constitutional: She is oriented to person, place, and time.  She appears well-developed and well-nourished. No distress.  HENT:  Head: Normocephalic and atraumatic.  Mouth/Throat: Oropharynx is clear and moist.  Eyes: Conjunctivae are normal. Pupils are equal, round, and reactive to light.  Neck: Normal range of motion.  Cardiovascular: Normal rate, regular rhythm and normal heart sounds.   No murmur heard. Pulmonary/Chest: Effort normal and breath sounds normal. No respiratory distress.  Abdominal: Soft. Bowel sounds are normal. She exhibits no distension. There is no tenderness.  Genitourinary:  Vaginal blood some clots.  Musculoskeletal: Normal range of motion.  Neurological:  She is alert and oriented to person, place, and time. No cranial nerve deficit. She exhibits normal muscle tone. Coordination normal.  Nursing note and vitals reviewed.   ED Course  Procedures (including critical care time) Labs Review Labs Reviewed  CBC - Abnormal; Notable for the following:    RBC 3.36 (*)    Hemoglobin 11.0 (*)    HCT 32.2 (*)    All other components within normal limits  COMPREHENSIVE METABOLIC PANEL - Abnormal; Notable for the following:    Calcium 8.7 (*)    Total Protein 6.1 (*)    Anion gap 3 (*)    All other components within normal limits  URINALYSIS, ROUTINE W REFLEX MICROSCOPIC (NOT AT [facility]) - Abnormal; Notable for the following:    Color, Urine RED (*)    APPearance CLOUDY (*)    Hgb urine dipstick LARGE (*)    Ketones, ur 15 (*)    Protein, ur 30 (*)    Leukocytes, UA SMALL (*)    All other components within normal limits  HEMOGLOBIN AND HEMATOCRIT, BLOOD - Abnormal; Notable for the following:    Hemoglobin 9.7 (*)    HCT 28.7 (*)    All other components within normal limits  I-STAT CHEM 8, ED - Abnormal; Notable for the following:    Calcium, Ion 1.25 (*)    Hemoglobin 11.2 (*)    HCT 33.0 (*)    All other components within normal limits  LIPASE, BLOOD  URINE MICROSCOPIC-ADD ON  I-STAT BETA HCG BLOOD, ED ([facility], [facility], [facility] ONLY)   Results for orders placed or performed during the hospital encounter of 02/03/15  CBC  Result Value Ref Range   WBC 7.8 4.0 - 10.5 K/uL   RBC 3.36 (L) 3.87 - 5.11 MIL/uL   Hemoglobin 11.0 (L) 12.0 - 15.0 g/dL   HCT 32.2 (L) 36.0 - 46.0 %   MCV 95.8 78.0 - 100.0 fL   MCH 32.7 26.0 - 34.0 pg   MCHC 34.2 30.0 - 36.0 g/dL   RDW 11.9 11.5 - 15.5 %   Platelets 270 150 - 400 K/uL  Comprehensive metabolic panel  Result Value Ref Range   Sodium 138 135 - 145 mmol/L   Potassium 4.4 3.5 - 5.1 mmol/L   Chloride 107 101 - 111 mmol/L   CO2 28 22 - 32 mmol/L   Glucose, Bld 95 65 - 99 mg/dL   BUN 10 6 - 20 mg/dL    Creatinine, Ser 0.55 0.44 - 1.00 mg/dL   Calcium 8.7 (L) 8.9 - 10.3 mg/dL   Total Protein 6.1 (L) 6.5 - 8.1 g/dL   Albumin 3.6 3.5 - 5.0 g/dL   AST 21 15 - 41 U/L   ALT 24 14 - 54 U/L   Alkaline Phosphatase 43 38 - 126 U/L   Total Bilirubin 0.3 0.3 - 1.2 mg/dL   GFR calc non Af Amer >60 >60 mL/min  GFR calc Af Amer >60 >60 mL/min   Anion gap 3 (L) 5 - 15  Lipase, blood  Result Value Ref Range   Lipase 32 11 - 51 U/L  Urinalysis, Routine w reflex microscopic (not at [facility])  Result Value Ref Range   Color, Urine RED (A) YELLOW   APPearance CLOUDY (A) CLEAR   Specific Gravity, Urine 1.015 1.005 - 1.030   pH 6.0 5.0 - 8.0   Glucose, UA NEGATIVE NEGATIVE mg/dL   Hgb urine dipstick LARGE (A) NEGATIVE   Bilirubin Urine NEGATIVE NEGATIVE   Ketones, ur 15 (A) NEGATIVE mg/dL   Protein, ur 30 (A) NEGATIVE mg/dL   Urobilinogen, UA 0.2 0.0 - 1.0 mg/dL   Nitrite NEGATIVE NEGATIVE   Leukocytes, UA SMALL (A) NEGATIVE  Urine microscopic-add on  Result Value Ref Range   RBC / HPF TOO NUMEROUS TO COUNT <3 RBC/hpf  Hemoglobin and hematocrit, blood  Result Value Ref Range   Hemoglobin 9.7 (L) 12.0 - 15.0 g/dL   HCT 28.7 (L) 36.0 - 46.0 %  I-Stat Chem 8, ED  Result Value Ref Range   Sodium 139 135 - 145 mmol/L   Potassium 4.3 3.5 - 5.1 mmol/L   Chloride 104 101 - 111 mmol/L   BUN 11 6 - 20 mg/dL   Creatinine, Ser 0.60 0.44 - 1.00 mg/dL   Glucose, Bld 92 65 - 99 mg/dL   Calcium, Ion 1.25 (H) 1.12 - 1.23 mmol/L   TCO2 25 0 - 100 mmol/L   Hemoglobin 11.2 (L) 12.0 - 15.0 g/dL   HCT 33.0 (L) 36.0 - 46.0 %  I-Stat Beta hCG blood, ED ([facility], [facility], [facility] only)  Result Value Ref Range   I-stat hCG, quantitative <5.0 <5 mIU/mL   Comment 3             Imaging Review Ct Abdomen Pelvis W Contrast  02/03/2015  CLINICAL DATA:  44 year old female with history of menorrhagia, status post cervical polypectomy in October 2015. Increased bleeding. EXAM: CT ABDOMEN AND PELVIS WITH CONTRAST TECHNIQUE:  Multidetector CT imaging of the abdomen and pelvis was performed using the standard protocol following bolus administration of intravenous contrast. CONTRAST:  180mL OMNIPAQUE IOHEXOL 300 MG/ML  SOLN COMPARISON:  Pelvic ultrasound 01/17/2015. FINDINGS: Lower chest:  Unremarkable. Hepatobiliary: Sub cm low-attenuation lesion in segment 7 of the liver is too small to definitively characterize, but is statistically likely a tiny cyst. No other larger more suspicious appearing cystic or solid hepatic lesions are noted. No intra or extrahepatic biliary ductal dilatation. Gallbladder is normal in appearance. Pancreas: No pancreatic mass. No pancreatic ductal dilatation. No pancreatic or peripancreatic fluid or inflammatory changes. Spleen: Unremarkable. Adrenals/Urinary Tract: Sub cm low-attenuation lesion in the lower pole of the right kidney is too small to definitively characterize, but is statistically likely a tiny cyst. Left kidney is normal in appearance. No hydroureteronephrosis. Urinary bladder is normal in appearance. Bilateral adrenal glands are normal in appearance. Stomach/Bowel: Normal appearance of the stomach. No pathologic dilatation of small bowel or colon. Normal appendix. Vascular/Lymphatic: No significant atherosclerotic disease, aneurysm or dissection identified in the abdominal or pelvic vasculature. No lymphadenopathy noted in the abdomen or pelvis. Reproductive: Sagittal image 84 of series 6 demonstrates what appears to be a long finger-like polyp which appears to be attached near the fundal portion of the endometrial canal along the right posterior wall of the uterus, and appears to be partially prolapsing out the cervical os. This measures 7.4 x 1.5 x 1.5  cm. There is fluid in the endometrial canal, likely blood. Additionally, there is fluid in the vagina. There is also a 2.2 cm heterogeneously enhancing lesion extending off the anterior wall of the body of the uterus on the right side, likely  to represent a fibroid. Ovaries are unremarkable in appearance. Other: No significant volume of ascites.  No pneumoperitoneum. Musculoskeletal: There are no aggressive appearing lytic or blastic lesions noted in the visualized portions of the skeleton. IMPRESSION: 1. Large pedunculated endometrial polyp which extends a length of approximately 7.4 cm, and is partially prolapsing out the cervical os. There is fluid in the endometrial canal and vagina, which appears to be blood. OB-Gyn referral for polypectomy is recommended. 2. 2.2 cm heterogeneously enhancing lesion in the right side of the uterine body is most likely to represent a small fibroid. 3. No other acute findings in the abdomen or pelvis. 4. Normal appendix. 5. Additional incidental findings, as above. Electronically Signed   By: Vinnie Langton M.D.   On: 02/03/2015 15:51   I have personally reviewed and evaluated these images and lab results as part of my medical decision-making.   EKG Interpretation None      MDM   Final diagnoses:  Vaginal bleeding  Uterine polyp    Chart review shows that patient has been followed by [facility] that they recognized as of October 19 that patient had this endometrial or endocervical polyp. Appears that from the notes they were trying to arrange surgery to have this removed. Patient presents today with increased bleeding but is clear from the notes she's had bleeding for several weeks. Patient is stable here able to ambulate she is small side think her blood pressures of 99 are normal for her she's not tachycardic original hemoglobin hematocrit was stable. Will contact whoever is covering for Dr. Zelphia Cairo group to discuss the situation. Patient not hemodynamically unstable here but has been having some heavy vaginal bleeding with clots. Repeat in the hemoglobin hematocrit is pending.  Patient's husband states that they were going to be able to do the procedure until November 29 not clear  if this is accurate. And he is concerned because his wife continues to have the heavy vaginal bleeding.  We'll discuss whoever is covering for Dr. Zelphia Cairo group.    Fredia Sorrow, MD 02/03/15 [place], MD 02/03/15 1625  Discussed with Dr. Toney Rakes from the group. They're to start her on Megace I will provide the prescription. We'll start her on iron tablets as well. Discussed with patient and her husband they can get an English-speaking person on Monday to call the office after 9 in the morning at the number provided. They want him to call [phone].  Fredia Sorrow, MD 02/03/15 1636   Updated Dr. Toney Rakes on the drop in hemoglobin from 11-9.7. He once had the patient transferred to [facility] hospital for evaluation there by him. We'll arrange transfer by [facility]. Patient still basically hemodynamically stable. The most recent blood pressure was 91 systolic not tachycardic. Patient asymptomatic. Second IV will be started.   Fredia Sorrow, MD 02/03/15 [place], MD 02/03/15 [id number]

## 2015-02-03 NOTE — Anesthesia Postprocedure Evaluation (Signed)
  Anesthesia Post-op Note  Patient: Candace Mccormick  Procedure(s) Performed: Procedure(s): DILATATION & CURETTAGE/HYSTEROSCOPY WITH MYOSURE (N/A)  Patient Location: PACU  Anesthesia Type:General  Level of Consciousness: awake, alert  and oriented  Airway and Oxygen Therapy: Patient Spontanous Breathing  Post-op Pain: none  Post-op Assessment: Post-op Vital signs reviewed, Patient's Cardiovascular Status Stable, Respiratory Function Stable, Patent Airway, No signs of Nausea or vomiting and Pain level controlled              Post-op Vital Signs: Reviewed and stable  Last Vitals:  Filed Vitals:   02/03/15 2130  BP: 107/61  Pulse: 70  Temp: 37.1 C  Resp: 18    Complications: No apparent anesthesia complications

## 2015-02-05 ENCOUNTER — Encounter (HOSPITAL_COMMUNITY): Payer: Self-pay | Admitting: Gynecology

## 2015-02-05 MED ORDER — VASOPRESSIN 20 UNIT/ML IV SOLN
INTRAVENOUS | Status: DC | PRN
  Administered 2015-02-03: 7 [IU]

## 2015-02-05 MED ORDER — SODIUM CHLORIDE 0.9 % IJ SOLN
INTRAMUSCULAR | Status: DC | PRN
  Administered 2015-02-03: 20 mL via INTRAVENOUS

## 2015-02-08 ENCOUNTER — Encounter (HOSPITAL_COMMUNITY): Payer: Self-pay | Admitting: *Deleted

## 2015-02-08 ENCOUNTER — Encounter: Payer: Self-pay | Admitting: *Deleted

## 2015-02-08 ENCOUNTER — Inpatient Hospital Stay (HOSPITAL_COMMUNITY): Payer: 59

## 2015-02-08 ENCOUNTER — Encounter (HOSPITAL_COMMUNITY): Payer: Self-pay

## 2015-02-08 ENCOUNTER — Inpatient Hospital Stay (HOSPITAL_COMMUNITY)
Admission: AD | Admit: 2015-02-08 | Discharge: 2015-02-11 | DRG: 742 | Disposition: A | Discharge status: Home / Self Care | Payer: 59 | Source: Ambulatory Visit | Attending: Gynecology | Admitting: Gynecology

## 2015-02-08 ENCOUNTER — Inpatient Hospital Stay (EMERGENCY_DEPARTMENT_HOSPITAL)
Admission: AD | Admit: 2015-02-08 | Discharge: 2015-02-08 | Disposition: A | Discharge status: Home / Self Care | Payer: 59 | Source: Ambulatory Visit | Attending: Gynecology | Admitting: Gynecology

## 2015-02-08 DIAGNOSIS — N938 Other specified abnormal uterine and vaginal bleeding: Principal | ICD-10-CM | POA: Diagnosis present

## 2015-02-08 DIAGNOSIS — R5082 Postprocedural fever: Secondary | ICD-10-CM | POA: Diagnosis not present

## 2015-02-08 DIAGNOSIS — Z9889 Other specified postprocedural states: Secondary | ICD-10-CM

## 2015-02-08 DIAGNOSIS — R710 Precipitous drop in hematocrit: Secondary | ICD-10-CM | POA: Diagnosis present

## 2015-02-08 DIAGNOSIS — E8770 Fluid overload, unspecified: Secondary | ICD-10-CM | POA: Diagnosis not present

## 2015-02-08 DIAGNOSIS — K5900 Constipation, unspecified: Secondary | ICD-10-CM | POA: Diagnosis present

## 2015-02-08 DIAGNOSIS — D62 Acute posthemorrhagic anemia: Secondary | ICD-10-CM | POA: Diagnosis present

## 2015-02-08 DIAGNOSIS — N840 Polyp of corpus uteri: Secondary | ICD-10-CM | POA: Diagnosis present

## 2015-02-08 DIAGNOSIS — Z9851 Tubal ligation status: Secondary | ICD-10-CM

## 2015-02-08 DIAGNOSIS — Z90710 Acquired absence of both cervix and uterus: Secondary | ICD-10-CM | POA: Diagnosis present

## 2015-02-08 DIAGNOSIS — R34 Anuria and oliguria: Secondary | ICD-10-CM | POA: Diagnosis present

## 2015-02-08 DIAGNOSIS — I959 Hypotension, unspecified: Secondary | ICD-10-CM | POA: Diagnosis present

## 2015-02-08 DIAGNOSIS — R42 Dizziness and giddiness: Secondary | ICD-10-CM | POA: Diagnosis present

## 2015-02-08 DIAGNOSIS — H5713 Ocular pain, bilateral: Secondary | ICD-10-CM | POA: Diagnosis not present

## 2015-02-08 DIAGNOSIS — D500 Iron deficiency anemia secondary to blood loss (chronic): Secondary | ICD-10-CM | POA: Diagnosis not present

## 2015-02-08 DIAGNOSIS — N939 Abnormal uterine and vaginal bleeding, unspecified: Secondary | ICD-10-CM

## 2015-02-08 DIAGNOSIS — N9989 Other postprocedural complications and disorders of genitourinary system: Secondary | ICD-10-CM

## 2015-02-08 LAB — CBC
HCT: 24.4 % — ABNORMAL LOW (ref 36.0–46.0)
HEMATOCRIT: 20.2 % — AB (ref 36.0–46.0)
Hemoglobin: 8.4 g/dL — ABNORMAL LOW (ref 12.0–15.0)
Hemoglobin: 6.9 g/dL — CL (ref 12.0–15.0)
MCH: 33.9 pg (ref 26.0–34.0)
MCH: 34 pg (ref 26.0–34.0)
MCHC: 34.4 g/dL (ref 30.0–36.0)
MCHC: 34.2 g/dL (ref 30.0–36.0)
MCV: 98.4 fL (ref 78.0–100.0)
MCV: 99.5 fL (ref 78.0–100.0)
PLATELETS: 326 10*3/uL (ref 150–400)
Platelets: 297 10*3/uL (ref 150–400)
RBC: 2.48 MIL/uL — AB (ref 3.87–5.11)
RBC: 2.03 MIL/uL — ABNORMAL LOW (ref 3.87–5.11)
RDW: 13.4 % (ref 11.5–15.5)
RDW: 14.1 % (ref 11.5–15.5)
WBC: 11.8 10*3/uL — ABNORMAL HIGH (ref 4.0–10.5)
WBC: 14.2 10*3/uL — ABNORMAL HIGH (ref 4.0–10.5)

## 2015-02-08 LAB — MRSA PCR SCREENING: MRSA BY PCR: NEGATIVE

## 2015-02-08 LAB — PREPARE RBC (CROSSMATCH)

## 2015-02-08 MED ORDER — BUTORPHANOL TARTRATE 1 MG/ML IJ SOLN
INTRAMUSCULAR | Status: AC
  Administered 2015-02-08: 17:00:00
  Filled 2015-02-08: qty 1

## 2015-02-08 MED ORDER — BUTORPHANOL TARTRATE 1 MG/ML IJ SOLN: 2.0000 mg | INTRAMUSCULAR | Status: DC | PRN

## 2015-02-08 MED ORDER — SODIUM CHLORIDE 0.9 % IV SOLN
Freq: Once | INTRAVENOUS | Status: AC
  Administered 2015-02-08: 21:00:00 via INTRAVENOUS

## 2015-02-08 MED ORDER — BUTORPHANOL TARTRATE 1 MG/ML IJ SOLN
INTRAMUSCULAR | Status: AC
  Filled 2015-02-08: qty 1

## 2015-02-08 MED ORDER — BUTORPHANOL TARTRATE 1 MG/ML IJ SOLN
1.0000 mg | Freq: Four times a day (QID) | INTRAMUSCULAR | Status: DC
  Administered 2015-02-09: 1 mg via INTRAVENOUS
  Filled 2015-02-08: qty 1

## 2015-02-08 MED ORDER — ESTROGENS CONJUGATED 25 MG IJ SOLR
25.0000 mg | Freq: Four times a day (QID) | INTRAMUSCULAR | Status: DC
  Administered 2015-02-08 – 2015-02-09 (×3): 25 mg via INTRAVENOUS
  Filled 2015-02-08 (×5): qty 25

## 2015-02-08 MED ORDER — MEGESTROL ACETATE 40 MG PO TABS
40.0000 mg | ORAL_TABLET | Freq: Once | ORAL | Status: AC
  Administered 2015-02-08: 40 mg via ORAL
  Filled 2015-02-08: qty 1

## 2015-02-08 MED ORDER — BUTORPHANOL TARTRATE 1 MG/ML IJ SOLN
1.0000 mg | Freq: Once | INTRAMUSCULAR | Status: AC
  Administered 2015-02-08: 1 mg via INTRAVENOUS

## 2015-02-08 MED ORDER — LACTATED RINGERS IV SOLN
INTRAVENOUS | Status: DC
  Administered 2015-02-09 (×2): via INTRAVENOUS

## 2015-02-08 MED ORDER — INFLUENZA VAC SPLIT QUAD 0.5 ML IM SUSY
0.5000 mL | PREFILLED_SYRINGE | INTRAMUSCULAR | Status: DC
  Filled 2015-02-08: qty 0.5

## 2015-02-08 MED ORDER — LACTATED RINGERS IV BOLUS (SEPSIS): 1000.0000 mL | Freq: Once | INTRAVENOUS | Status: DC

## 2015-02-08 MED ORDER — BUTORPHANOL TARTRATE 1 MG/ML IJ SOLN: 2.0000 mg | Freq: Four times a day (QID) | INTRAMUSCULAR | Status: DC

## 2015-02-08 NOTE — MAU Provider Note (Signed)
History     CSN: [id number]  Arrival date and time: 02/08/15 1523   First Provider Initiated Contact with Patient 02/08/15 1532      No chief complaint on file.  HPI   [name] is a 44 y.o. female [phone] presenting with dizziness and vaginal bleeding. The patient is S/P DILATATION & CURETTAGE/HYSTEROSCOPY WITH MYOSURE resectoscopic polypectomy from 11/5.  The patient fainted 1500 this afternoon and felt dizzy this afternoon and the patients husband called 911.  Since waking this morning she attests to changing her peri pad 3 times.   She was here yesterday and her Hgb was 8 she was sent home with megace and has been taking it as ordered.  She has taken 2 doses since leaving MAU yesterday. Once last night at 8 pm and the second dose at 8 am.   She currently rates her pain 0/10  + HA  OB History    Gravida Para Term Preterm AB TAB SAB Ectopic Multiple Living   4 3 3  1  1   3       Past Medical History  Diagnosis Date  . Cervical polyp   . Anemia     Past Surgical History  Procedure Laterality Date  . Cervical polypectomy    . Cesarean section      x 2 with BTL  . Dilatation & curettage/hysteroscopy with myosure N/A 02/03/2015    Procedure: DILATATION & CURETTAGE/HYSTEROSCOPY WITH MYOSURE;  Surgeon: Terrance Mass, MD;  Location: [facility];  Service: Gynecology;  Laterality: N/A;    No family history on file.  Social History  Substance Use Topics  . Smoking status: Never Smoker   . Smokeless tobacco: Never Used  . Alcohol Use: No    Allergies: No Known Allergies  Prescriptions prior to admission  Medication Sig Dispense Refill Last Dose  . ferrous sulfate 325 (65 FE) MG EC tablet Take 1 tablet (325 mg total) by mouth 3 (three) times daily with meals. 90 tablet 0 02/07/2015 at Unknown time  . ibuprofen (ADVIL,MOTRIN) 200 MG tablet Take 600 mg by mouth every 4 (four) hours as needed (pain).   02/07/2015 at Unknown time  . megestrol (MEGACE) 40 MG tablet  Take 40 mg by mouth 3 (three) times daily.   02/07/2015 at 1600   Results for orders placed or performed during the hospital encounter of 02/08/15 (from the past 48 hour(s))  CBC     Status: Abnormal   Collection Time: 02/08/15  3:30 PM  Result Value Ref Range   WBC 14.2 (H) 4.0 - 10.5 K/uL   RBC 2.03 (L) 3.87 - 5.11 MIL/uL   Hemoglobin 6.9 (LL) 12.0 - 15.0 g/dL    Comment: REPEATED TO VERIFY CRITICAL RESULT CALLED TO, READ BACK BY AND VERIFIED WITH: SPOKE TO DANDREAE @ 1558 ON 11.10.16 BY BOSTONC    HCT 20.2 (L) 36.0 - 46.0 %   MCV 99.5 78.0 - 100.0 fL   MCH 34.0 26.0 - 34.0 pg   MCHC 34.2 30.0 - 36.0 g/dL   RDW 14.1 11.5 - 15.5 %   Platelets 297 150 - 400 K/uL    Review of Systems  Constitutional: Positive for chills.  Neurological: Positive for dizziness, loss of consciousness and weakness.   Physical Exam   Blood pressure 97/51, pulse 92, last menstrual period 01/28/2015.  Physical Exam  Constitutional: She is oriented to person, place, and time.  Genitourinary:  Bimanual exam: Cervix slightly open, anterior  Uterus non  tender, slightly enlarged  Adnexa non tender, no masses bilaterally Small amount of blood noted on exam glove.  Chaperone present for exam. No blood noted on peri pad  Neurological: She is alert and oriented to person, place, and time.  Skin: There is pallor.  Psychiatric: Her behavior is normal.    MAU Course  Procedures  None   MDM  CBC LR bolus X 2  Discussed patient with Dr. Toney Rakes; admit to 3rd floor.   Assessment and Plan   A:  1. Anemia due to acute blood loss   2. Episode of dizziness   3. Other postoperative complication involving genitourinary system    P:  Admit to 3rd floor 2 units PRBC's Pelvic US    Lezlie Lye, NP 02/08/2015 3:38 PM

## 2015-02-08 NOTE — MAU Note (Signed)
Pt came to the hospital via EMS complaining of vaginal bleeding.  Pt pressure on arrival was 90s/50s.  Pt states she fainted at 1500.  Pt was seen in MAU earlier today.

## 2015-02-08 NOTE — MAU Note (Signed)
Pt called out stating that she felt like she couldn't breathe. Pulse ox applied. O2 sat 100% and pulse 82. Lung sounds clear bilaterally. Dr. Toney Rakes in unit, notified and came to bedside. 6L of O2 applied with nasal cannula. BP checked manually. Dr. Toney Rakes wants pt in AICU. House coverage notified.

## 2015-02-08 NOTE — H&P (Signed)
@LOGO @  Candace Mccormick is an 44 y.o. female gravida 4 per 3 AB 1 who was brought to the emergency room today vi EMS as a result of heavy vaginal bleeding. On November 5 patient underwent a resectoscopic polypectomy as a result of a prolapsed uterine polyp and vaginal bleeding. On endometrial level blood pressure 88/38 millimeters mercury with a pulse of 69 and she was pale but with no cramping reported. She had been in the emergency room earlier today around midnight having complaining of vaginal bleeding but it seemed to taper off at that time and was instructed by my partner who was on-call Dr. Donalynn Furlong to start her on the Megace 40 mg twice a day and to continue her iron supplementation and to follow-up in the office. On arrival her hemoglobin was found to be down to 6.9. When she left the hospital early this morning her blood pressure was 100/54 with a pulse of 68 temperature 98.1 with a respiration of 18.  Pertinent Gynecological History: Menses: Dysfunctional uterine bleeding status post resectoscopic polypectomy few days ago Bleeding: Same as above Contraception: tubal ligation DES exposure: unknown Blood transfusions: none Sexually transmitted diseases: no past history Previous GYN Procedures: Resectoscopic polypectomy , 2 C-sections, tubal ligation Last mammogram: Unknown Date: Unknown Last pap: Unknown Date: Unknown OB History: G 2, P    Menstrual History: Menarche age: 39 Patient's last menstrual period was 01/28/2015.    Past Medical History  Diagnosis Date  . Cervical polyp   . Anemia     Past Surgical History  Procedure Laterality Date  . Cervical polypectomy    . Cesarean section      x 2 with BTL  . Dilatation & curettage/hysteroscopy with myosure N/A 02/03/2015    Procedure: DILATATION & CURETTAGE/HYSTEROSCOPY WITH MYOSURE;  Surgeon: Terrance Mass, MD;  Location: [facility];  Service: Gynecology;  Laterality: N/A;    No family history on file.  Social  History:  reports that she has never smoked. She has never used smokeless tobacco. She reports that she does not drink alcohol or use illicit drugs.  Allergies: No Known Allergies  Prescriptions prior to admission  Medication Sig Dispense Refill Last Dose  . ferrous sulfate 325 (65 FE) MG EC tablet Take 1 tablet (325 mg total) by mouth 3 (three) times daily with meals. 90 tablet 0 02/07/2015 at Unknown time  . ibuprofen (ADVIL,MOTRIN) 200 MG tablet Take 600 mg by mouth every 4 (four) hours as needed (pain).   02/07/2015 at Unknown time  . megestrol (MEGACE) 40 MG tablet Take 40 mg by mouth 3 (three) times daily.   02/07/2015 at 1600    REVIEW OF SYSTEMS: A ROS was performed and pertinent positives and negatives are included in the history.  GENERAL: No fevers or chills. HEENT: No change in vision, no earache, sore throat or sinus congestion. NECK: No pain or stiffness. CARDIOVASCULAR: No chest pain or pressure. No palpitations. PULMONARY: No shortness of breath, cough or wheeze. GASTROINTESTINAL: No abdominal pain, nausea, vomiting or diarrhea, melena or bright red blood per rectum. GENITOURINARY: No urinary frequency, urgency, hesitancy or dysuria. MUSCULOSKELETAL: No joint or muscle pain, no back pain, no recent trauma. DERMATOLOGIC: No rash, no itching, no lesions. ENDOCRINE: No polyuria, polydipsia, no heat or cold intolerance. No recent change in weight. HEMATOLOGICAL: See above NEUROLOGIC: No headache, seizures, numbness, tingling or weakness. PSYCHIATRIC: No depression, no loss of interest in normal activity or change in sleep pattern.     Blood  pressure 98/33, pulse 72, temperature 97.8 F (36.6 C), temperature source Oral, last menstrual period 01/28/2015.  Physical Exam:  HEENT:unremarkable Neck:Supple, midline, no thyroid megaly, no carotid bruits Lungs:  Clear to auscultation no rhonchi's or wheezes Heart:Regular rate and rhythm, no murmurs or gallops Breast Exam: Not  examined Abdomen: Soft nontender no rebound or guarding Pelvic:BUS within normal limits blood was noted on the external genitalia Vagina: Large blood clots and blood present in the vaginal vault Cervix: Blood was noted extruding from the cervical canal Uterus: Anteverted nontender normal size Adnexa: No palpable masses or tenderness Extremities: No cords, no edema Rectal: Not done  Results for orders placed or performed during the hospital encounter of 02/08/15 (from the past 24 hour(s))  CBC     Status: Abnormal   Collection Time: 02/08/15  3:30 PM  Result Value Ref Range   WBC 14.2 (H) 4.0 - 10.5 K/uL   RBC 2.03 (L) 3.87 - 5.11 MIL/uL   Hemoglobin 6.9 (LL) 12.0 - 15.0 g/dL   HCT 20.2 (L) 36.0 - 46.0 %   MCV 99.5 78.0 - 100.0 fL   MCH 34.0 26.0 - 34.0 pg   MCHC 34.2 30.0 - 36.0 g/dL   RDW 14.1 11.5 - 15.5 %   Platelets 297 150 - 400 K/uL  Type and screen [place]     Status: None (Preliminary result)   Collection Time: 02/08/15  3:30 PM  Result Value Ref Range   ABO/RH(D) O POS    Antibody Screen NEG    Sample Expiration 02/11/2015    Unit Number [id number]    Blood Component Type RED CELLS,LR    Unit division 00    Status of Unit ISSUED    Transfusion Status OK TO TRANSFUSE    Crossmatch Result Compatible    Unit Number [id number]    Blood Component Type RED CELLS,LR    Unit division 00    Status of Unit ALLOCATED    Transfusion Status OK TO TRANSFUSE    Crossmatch Result Compatible   Prepare RBC     Status: None   Collection Time: 02/08/15  4:12 PM  Result Value Ref Range   Order Confirmation ORDER PROCESSED BY BLOOD BANK     No results found.  Assessment/Plan: 44 year old patient status post resectoscopic polypectomy 5 days ago as a result of prolapsing endometrial polyp had been seen in the emergency room early this morning with acute onset of bleeding once again slowed down to minimum and was stable enough that she was discharged  home to return back this afternoon via EMS hypotensive, pale but not tachycardic. In an effort to tamponade the bleeding from her uterus a 20 cc Foley catheter was placed into the uterine cavity. A Foley catheter was placed in the bladder to monitor urinary output. She was crossmatch was tenderness of packed red blood cell and started Premarin IV 25 mg every 6 hours. We will check her CBC after she receives her 2 units. We will reassess in the morning after removing the intrauterine Foley catheter. If stable enough to discharge on we will do so. Options discussed with the husband will are as follows: Take her back to the operating room for diagnostic hysteroscopy to identify the possible bleeding site is still bleeding and then discharge home or to proceed with emergency abdominal hysterectomy with ovarian conservation. Patient will be Nothing by mouth after midnight  Candace Mccormick H 02/08/2015, 6:04 PM

## 2015-02-08 NOTE — Progress Notes (Signed)
Plan of care discussed with patient and husband through [employer].  Verbal consent obtained for first unit PRBC which was hung in MAU prior to admission to AICU.  Pt education completed  and consent obtained for blood product administration through interpreter.  Patient guide discussed, pt does not have advanced directives and declined information at this time.  Pain level discussed and pain medications that are ordered.  Discussed clear liquid diet at this time and NPO after midnight.  Pt comfortable with plan of care and all questions answered to her satisfaction.  Total time with interpreter approx 15 minutes.

## 2015-02-08 NOTE — MAU Note (Signed)
Pt arrives via EMS with onset of heavy vaginal bleeding.pt is s/p ablation on Saturday.

## 2015-02-08 NOTE — MAU Note (Signed)
CRITICAL VALUE ALERT  Critical value received:  Hemoglobin 6.9  Date of notification:  02/08/15  Time of notification:  [id number]  Critical value read back:Yes.    Nurse who received alert:  Doug Sou, RN  MD notified (1st page):  Noni Saupe, NP  Time of first page:  [phone]  Responding MD:  Noni Saupe, NP  Time MD responded:  [phone]

## 2015-02-08 NOTE — Discharge Instructions (Signed)
Ch?y mu t? cung do r?i lo?n c? n?ng (Dysfunctional Uterine Bleeding) Ch?y mu t? cung do r?i lo?n c? n?ng l ch?y mu b?t th??ng ? t? cung. Ch?y mu t? cung do r?i lo?n c? n?ng bao g?m:  K? kinh nguy?t ??n s?m h?n ho?c mu?n h?n bnh th??ng.  K? kinh nguy?t nh? h?n, n?ng h?n, ho?c c cc c?c mu ?ng.  Ch?y mu gi?a cc chu k? kinh nguy?t.  B? qua m?t ho?c nhi?u k? kinh nguy?t.  Ch?y mu sau khi giao h?p.  Ch?y mu sau khi mn kinh. H??NG D?N CH?M [place] T?I NH  Ch  ??n nh?ng thay ??i v? tri?u ch?ng c?a qu v?. Lm theo nh?ng h??ng d?n sau ?? gip c?i thi?n tnh tr?ng c?a qu v?: ?n  ?n nh?ng th?c ?n cn b?ng. ?n nh?ng th?c ?n giu ch?t s?t, ch?ng h?n nh? gan, th?t, ??ng v?t c v?, rau l xanh v tr?ng.  N?u qu v? b? to bn:  U?ng th?t nhi?u n??c.  ?n nh?ng lo?i tri cy v rau c nhi?u n??c v ch?t x?, ch?ng h?n rau bina (spinach), c r?t, qu? mm xi, to v xoi. Thu?c  Ch? s? d?ng thu?c khng c?n k ??n v thu?c c?n k ??n theo ch? d?n c?a chuyn gia ch?m [place] s?c kh?e.  Khng thay ??i thu?c m khng h?i  ki?n c?a chuyn gia ch?m [place] s?c kh?e.  Aspirin ho?c cc thu?c c ch?a aspirin c th? lm ch?y mu n?ng h?n. Khng dng nh?ng lo?i thu?c ?:  Trong tu?n tr??c khi ??n k? kinh nguy?t.  Trong k? kinh nguy?t.  N?u qu v? ???c k vin thu?c s?t, hy dng thu?c theo ch? d?n c?a chuyn gia ch?m [place] s?c kh?e. Vin thu?c s?t gip thay th? l??ng s?t m c? th? qu v? ? b? m?t do tnh tr?ng ny. Ho?t ??ng  N?u qu v? c?n thay b?ng v? sinh ho?c nt b?ng v? sinh nhi?u l?n trong m?i 2 gi?:  N?m trn gi??ng v k cao chn (nng cao).  ??t m?t ti ch??m l?nh ln ph?n b?ng d??i c?a qu v?.  Ngh? ng?i cng nhi?u cng t?t cho ??n khi ch?y mu d?ng l?i ho?c ch?m l?i.  Khng c? g?ng gi?m cn cho ??n khi tnh tr?ng ch?y mu d?ng l?i v n?ng ?? s?t trong mu c?a qu v? tr? l?i bnh th??ng. Nh?ng h??ng d?n khc  Trong hai thng, ghi l?i:  Khi no chu k? kinh nguy?n c?a qu v?  b?t ??u.  Khi no chu k? kinh nguy?t c?a qu v? k?t thc.  Khi no vi?c ch?y mu b?t th??ng x?y ra.  Qu v? nh?n th?y v?n ?? g.  Tun th? t?t c? cc cu?c h?n khm l?i theo ch? d?n c?a chuyn gia ch?m [place] s?c kh?e. ?i?u ny c vai tr quan tr?ng. ?I KHM N?U:  Qu v? b? chong vng ho?c y?u ?t.  Qu v? b? bu?n nn v nn m?a.  Qu v? khng th? ?n ho?c u?ng m khng b? nn.  Qu v? c?m th?y chng m?t ho?c b? tiu ch?y khi qu v? ?ang dng thu?c.  Qu v? ?ang dng vin thu?c trnh [place] ho?c hoc-mn, v qu v? mu?n thay th? ho?c d?ng s? d?ng chng. NGAY L?P T?C ?I KHM N?U:  Qu v? b? s?t ho?c ?n l?nh.  Qu v? c?n thay b?ng v? sinh ho?c nt b?ng v? sinh ho?c nt b?ng v? sinh nhi?u l?n trong m?i gi?:  Qu  v? ch?y mu n?ng h?n, ho?c dng mu c ch?a cc c?c mu ?ng th??ng xuyn h?n.  Qu v? b? ?au b?ng.  Qu v? b? b?t t?nh.  Qu v? b? pht ban.   Thng tin ny khng nh?m m?c ?ch thay th? cho l?i khuyn m chuyn gia ch?m [place] s?c kh?e ni v?i qu v?. Hy b?o ??m qu v? ph?i th?o lu?n b?t k? v?n ?? g m qu v? c v?i chuyn gia ch?m [place] s?c kh?e c?a qu v?.   Document Released: 03/17/2005 Document Revised: 12/06/2014 Elsevier Interactive Patient Education [employer].

## 2015-02-08 NOTE — MAU Note (Signed)
Report called to [place] RN on [employer]. Will go to [phone] after pelvic u/s

## 2015-02-08 NOTE — MAU Provider Note (Signed)
History     CSN: [id number]  Arrival date and time: 02/08/15 0114   First Provider Initiated Contact with Patient 02/08/15 0148      Chief Complaint  Patient presents with  . Vaginal Bleeding   HPI Comments: Candace Mccormick is a 44 y.o. [id number] who presents today with vaginal bleeding. She had [place] resectoscopic polypectomy done on 02/03/15. At that time she was having heavy bleeding. She states that the bleeding had been scant up until tonight at 2200.   Vaginal Bleeding The patient's primary symptoms include vaginal bleeding. This is a new problem. The current episode started today (at 2200 ). The problem occurs constantly. The problem has been unchanged. The patient is experiencing no pain. She is not pregnant. Pertinent negatives include no abdominal pain, constipation, diarrhea, dysuria, fever, nausea, urgency or vomiting. The vaginal discharge was bloody. The vaginal bleeding is heavier than menses. She has been passing clots. She has not been passing tissue. Nothing aggravates the symptoms. She has tried nothing for the symptoms.    Past Medical History  Diagnosis Date  . Cervical polyp   . Anemia     Past Surgical History  Procedure Laterality Date  . Cervical polypectomy    . Cesarean section      x 2 with BTL  . Dilatation & curettage/hysteroscopy with myosure N/A 02/03/2015    Procedure: DILATATION & CURETTAGE/HYSTEROSCOPY WITH MYOSURE;  Surgeon: Terrance Mass, MD;  Location: [facility];  Service: Gynecology;  Laterality: N/A;    History reviewed. No pertinent family history.  Social History  Substance Use Topics  . Smoking status: Never Smoker   . Smokeless tobacco: Never Used  . Alcohol Use: No    Allergies: No Known Allergies  Prescriptions prior to admission  Medication Sig Dispense Refill Last Dose  . ferrous sulfate 325 (65 FE) MG EC tablet Take 1 tablet (325 mg total) by mouth 3 (three) times daily with meals.  90 tablet 0 02/07/2015 at Unknown time  . ibuprofen (ADVIL,MOTRIN) 200 MG tablet Take 600 mg by mouth every 4 (four) hours as needed (pain).   02/07/2015 at Unknown time  . megestrol (MEGACE) 40 MG tablet Take 40 mg by mouth 3 (three) times daily.   02/07/2015 at 1600  . metoCLOPramide (REGLAN) 10 MG tablet Take 1 tablet (10 mg total) by mouth 3 (three) times daily with meals. 30 tablet 1   . Multiple Vitamin (MULTIVITAMIN) tablet Take 1 tablet by mouth daily.   02/02/2015 at Unknown time    Review of Systems  Constitutional: Negative for fever.  Gastrointestinal: Negative for nausea, vomiting, abdominal pain, diarrhea and constipation.  Genitourinary: Positive for vaginal bleeding. Negative for dysuria and urgency.   Physical Exam   Blood pressure 100/54, pulse 68, temperature 98.1 F (36.7 C), temperature source Oral, resp. rate 18, last menstrual period 01/28/2015.  Physical Exam  Nursing note and vitals reviewed. Constitutional: She is oriented to person, place, and time. She appears well-developed and well-nourished. No distress.  HENT:  Head: Normocephalic.  Cardiovascular: Normal rate.   Respiratory: Effort normal.  GI: Soft. There is no tenderness.  Genitourinary:   External: no lesion Vagina: small amount of white discharge Cervix: pink, smooth, no CMT Uterus: NSSC Adnexa: NT   Neurological: She is alert and oriented to person, place, and time.  Skin: Skin is warm and dry.   Results for orders placed or performed during the hospital encounter of 02/08/15 (from the past 24  hour(s))  CBC     Status: Abnormal   Collection Time: 02/08/15  1:55 AM  Result Value Ref Range   WBC 11.8 (H) 4.0 - 10.5 K/uL   RBC 2.48 (L) 3.87 - 5.11 MIL/uL   Hemoglobin 8.4 (L) 12.0 - 15.0 g/dL   HCT 24.4 (L) 36.0 - 46.0 %   MCV 98.4 78.0 - 100.0 fL   MCH 33.9 26.0 - 34.0 pg   MCHC 34.4 30.0 - 36.0 g/dL   RDW 13.4 11.5 - 15.5 %   Platelets 326 150 - 400 K/uL    MAU Course   Procedures  MDM 0230: D/W Dr. Phineas Real. Will have patient take megace 20mg  BID. Reviewed with patient: she had been taking 40mg  TID and then was told to decrease it to BID. Once she went to BID the bleeding started. Will have her go back to taking it TID. FU with. Dr. Phineas Real in one week. She is taking iron BID currently.   Assessment and Plan   1. Vaginal bleeding    DC home Comfort measures reviewed  Bleeding precautions RX: resume taking megace TID, continue iron at home.  Return to MAU as needed  Follow-up Information    Follow up with Anastasio Auerbach, MD In 1 week.   Specialties:  Gynecology, Radiology   Contact information:   [address] [place] [address] [phone]       Mathis Bud 02/08/2015, 1:51 AM

## 2015-02-09 ENCOUNTER — Encounter (HOSPITAL_COMMUNITY)
Admission: AD | Disposition: A | Discharge status: Home / Self Care | Payer: Self-pay | Source: Ambulatory Visit | Attending: Gynecology

## 2015-02-09 ENCOUNTER — Encounter (HOSPITAL_COMMUNITY): Payer: Self-pay | Admitting: *Deleted

## 2015-02-09 ENCOUNTER — Telehealth: Payer: Self-pay

## 2015-02-09 ENCOUNTER — Inpatient Hospital Stay (HOSPITAL_COMMUNITY): Payer: 59 | Admitting: Anesthesiology

## 2015-02-09 DIAGNOSIS — D500 Iron deficiency anemia secondary to blood loss (chronic): Secondary | ICD-10-CM

## 2015-02-09 DIAGNOSIS — D62 Acute posthemorrhagic anemia: Secondary | ICD-10-CM

## 2015-02-09 DIAGNOSIS — R34 Anuria and oliguria: Secondary | ICD-10-CM

## 2015-02-09 DIAGNOSIS — D72829 Elevated white blood cell count, unspecified: Secondary | ICD-10-CM

## 2015-02-09 DIAGNOSIS — H5713 Ocular pain, bilateral: Secondary | ICD-10-CM

## 2015-02-09 DIAGNOSIS — N939 Abnormal uterine and vaginal bleeding, unspecified: Secondary | ICD-10-CM

## 2015-02-09 DIAGNOSIS — Z9889 Other specified postprocedural states: Secondary | ICD-10-CM

## 2015-02-09 HISTORY — PX: ABDOMINAL HYSTERECTOMY: SHX81

## 2015-02-09 LAB — COMPREHENSIVE METABOLIC PANEL
ALK PHOS: 34 U/L — AB (ref 38–126)
ALT: 12 U/L — ABNORMAL LOW (ref 14–54)
ANION GAP: 3 — AB (ref 5–15)
AST: 13 U/L — ABNORMAL LOW (ref 15–41)
Albumin: 2.7 g/dL — ABNORMAL LOW (ref 3.5–5.0)
BILIRUBIN TOTAL: 0.8 mg/dL (ref 0.3–1.2)
BUN: 9 mg/dL (ref 6–20)
CALCIUM: 7.7 mg/dL — AB (ref 8.9–10.3)
CO2: 25 mmol/L (ref 22–32)
Chloride: 109 mmol/L (ref 101–111)
Creatinine, Ser: 0.43 mg/dL — ABNORMAL LOW (ref 0.44–1.00)
GFR calc non Af Amer: >60 mL/min (ref >60)
GLUCOSE: 130 mg/dL — AB (ref 65–99)
Potassium: 3.7 mmol/L (ref 3.5–5.1)
Sodium: 137 mmol/L (ref 135–145)
TOTAL PROTEIN: 4.8 g/dL — AB (ref 6.5–8.1)

## 2015-02-09 LAB — CBC
HCT: 29.5 % — ABNORMAL LOW (ref 36.0–46.0)
HCT: 30.3 % — ABNORMAL LOW (ref 36.0–46.0)
HEMATOCRIT: 26.3 % — AB (ref 36.0–46.0)
HEMATOCRIT: 27.9 % — AB (ref 36.0–46.0)
HEMOGLOBIN: 9.2 g/dL — AB (ref 12.0–15.0)
HEMOGLOBIN: 10.3 g/dL — AB (ref 12.0–15.0)
Hemoglobin: 9.6 g/dL — ABNORMAL LOW (ref 12.0–15.0)
Hemoglobin: 10.4 g/dL — ABNORMAL LOW (ref 12.0–15.0)
MCH: 32.3 pg (ref 26.0–34.0)
MCH: 31.5 pg (ref 26.0–34.0)
MCH: 30.3 pg (ref 26.0–34.0)
MCH: 30 pg (ref 26.0–34.0)
MCHC: 35 g/dL (ref 30.0–36.0)
MCHC: 34.9 g/dL (ref 30.0–36.0)
MCHC: 34.4 g/dL (ref 30.0–36.0)
MCHC: 34.3 g/dL (ref 30.0–36.0)
MCV: 92.3 fL (ref 78.0–100.0)
MCV: 90.2 fL (ref 78.0–100.0)
MCV: 88 fL (ref 78.0–100.0)
MCV: 87.3 fL (ref 78.0–100.0)
PLATELETS: 188 10*3/uL (ref 150–400)
PLATELETS: 142 10*3/uL — AB (ref 150–400)
PLATELETS: 172 10*3/uL (ref 150–400)
Platelets: 190 10*3/uL (ref 150–400)
RBC: 2.85 MIL/uL — ABNORMAL LOW (ref 3.87–5.11)
RBC: 3.27 MIL/uL — AB (ref 3.87–5.11)
RBC: 3.17 MIL/uL — ABNORMAL LOW (ref 3.87–5.11)
RBC: 3.47 MIL/uL — AB (ref 3.87–5.11)
RDW: 14.9 % (ref 11.5–15.5)
RDW: 15.8 % — ABNORMAL HIGH (ref 11.5–15.5)
RDW: 16.9 % — AB (ref 11.5–15.5)
RDW: 18 % — ABNORMAL HIGH (ref 11.5–15.5)
WBC: 21.2 10*3/uL — ABNORMAL HIGH (ref 4.0–10.5)
WBC: 22.2 10*3/uL — AB (ref 4.0–10.5)
WBC: 26.7 10*3/uL — AB (ref 4.0–10.5)
WBC: 21.3 10*3/uL — ABNORMAL HIGH (ref 4.0–10.5)

## 2015-02-09 LAB — PREPARE RBC (CROSSMATCH)

## 2015-02-09 LAB — APTT: aPTT: 37 seconds (ref 24–37)

## 2015-02-09 LAB — PROTIME-INR
INR: 1.38 (ref 0.00–1.49)
Prothrombin Time: 17.1 seconds — ABNORMAL HIGH (ref 11.6–15.2)

## 2015-02-09 LAB — FIBRINOGEN: Fibrinogen: 174 mg/dL — ABNORMAL LOW (ref 204–475)

## 2015-02-09 SURGERY — HYSTERECTOMY, ABDOMINAL
Anesthesia: General | Site: Abdomen

## 2015-02-09 MED ORDER — OXYCODONE-ACETAMINOPHEN 5-325 MG PO TABS
1.0000 | ORAL_TABLET | ORAL | Status: DC | PRN
  Administered 2015-02-10 – 2015-02-11 (×7): 1 via ORAL
  Filled 2015-02-09 (×7): qty 1

## 2015-02-09 MED ORDER — DEXTROSE 5 % IV SOLN
2.0000 g | INTRAVENOUS | Status: AC
  Administered 2015-02-09: 2 g via INTRAVENOUS
  Filled 2015-02-09: qty 2

## 2015-02-09 MED ORDER — GLYCOPYRROLATE 0.2 MG/ML IJ SOLN
INTRAMUSCULAR | Status: AC
  Filled 2015-02-09: qty 3

## 2015-02-09 MED ORDER — SCOPOLAMINE 1 MG/3DAYS TD PT72
MEDICATED_PATCH | TRANSDERMAL | Status: AC
  Filled 2015-02-09: qty 1

## 2015-02-09 MED ORDER — SODIUM CHLORIDE 0.9 % IJ SOLN: 9.0000 mL | INTRAMUSCULAR | Status: DC | PRN

## 2015-02-09 MED ORDER — SODIUM CHLORIDE 0.9 % IV SOLN: Freq: Once | INTRAVENOUS | Status: DC

## 2015-02-09 MED ORDER — SODIUM CHLORIDE 0.9 % IV SOLN
Freq: Once | INTRAVENOUS | Status: AC
  Administered 2015-02-09: 13:00:00 via INTRAVENOUS

## 2015-02-09 MED ORDER — FENTANYL CITRATE (PF) 100 MCG/2ML IJ SOLN
25.0000 ug | INTRAMUSCULAR | Status: DC | PRN
  Administered 2015-02-09 (×3): 50 ug via INTRAVENOUS

## 2015-02-09 MED ORDER — HYDROMORPHONE HCL 1 MG/ML IJ SOLN
INTRAMUSCULAR | Status: AC
  Filled 2015-02-09: qty 1

## 2015-02-09 MED ORDER — PROPOFOL 10 MG/ML IV BOLUS
INTRAVENOUS | Status: AC
  Filled 2015-02-09: qty 20

## 2015-02-09 MED ORDER — GLYCOPYRROLATE 0.2 MG/ML IJ SOLN
INTRAMUSCULAR | Status: DC | PRN
  Administered 2015-02-09: .4 mg via INTRAVENOUS

## 2015-02-09 MED ORDER — HYDROMORPHONE HCL 1 MG/ML IJ SOLN
0.5000 mg | INTRAMUSCULAR | Status: AC | PRN
  Administered 2015-02-09 (×3): 0.5 mg via INTRAVENOUS

## 2015-02-09 MED ORDER — LACTATED RINGERS IV SOLN
INTRAVENOUS | Status: DC | PRN
  Administered 2015-02-09: 13:00:00 via INTRAVENOUS

## 2015-02-09 MED ORDER — ROCURONIUM BROMIDE 100 MG/10ML IV SOLN
INTRAVENOUS | Status: AC
  Filled 2015-02-09: qty 1

## 2015-02-09 MED ORDER — PHENYLEPHRINE HCL 10 MG/ML IJ SOLN
INTRAMUSCULAR | Status: DC | PRN
  Administered 2015-02-09: 40 ug via INTRAVENOUS
  Administered 2015-02-09 (×2): 80 ug via INTRAVENOUS

## 2015-02-09 MED ORDER — ROCURONIUM BROMIDE 100 MG/10ML IV SOLN
INTRAVENOUS | Status: DC | PRN
  Administered 2015-02-09: 40 mg via INTRAVENOUS
  Administered 2015-02-09: 10 mg via INTRAVENOUS

## 2015-02-09 MED ORDER — SCOPOLAMINE 1 MG/3DAYS TD PT72
MEDICATED_PATCH | TRANSDERMAL | Status: DC | PRN
  Administered 2015-02-09: 1 via TRANSDERMAL

## 2015-02-09 MED ORDER — LACTATED RINGERS IV SOLN
INTRAVENOUS | Status: DC
  Administered 2015-02-09 – 2015-02-10 (×3): via INTRAVENOUS

## 2015-02-09 MED ORDER — DEXAMETHASONE SODIUM PHOSPHATE 4 MG/ML IJ SOLN
INTRAMUSCULAR | Status: AC
  Filled 2015-02-09: qty 1

## 2015-02-09 MED ORDER — MIDAZOLAM HCL 2 MG/2ML IJ SOLN
INTRAMUSCULAR | Status: DC | PRN
  Administered 2015-02-09: 1 mg via INTRAVENOUS

## 2015-02-09 MED ORDER — LIDOCAINE HCL (CARDIAC) 20 MG/ML IV SOLN
INTRAVENOUS | Status: DC | PRN
  Administered 2015-02-09: 100 mg via INTRAVENOUS

## 2015-02-09 MED ORDER — SODIUM CHLORIDE 0.9 % IV SOLN
Freq: Once | INTRAVENOUS | Status: AC
  Administered 2015-02-09: 06:00:00 via INTRAVENOUS

## 2015-02-09 MED ORDER — BUPIVACAINE LIPOSOME 1.3 % IJ SUSP
20.0000 mL | Freq: Once | INTRAMUSCULAR | Status: AC
  Administered 2015-02-09: 20 mL
  Filled 2015-02-09: qty 20

## 2015-02-09 MED ORDER — FENTANYL CITRATE (PF) 100 MCG/2ML IJ SOLN
INTRAMUSCULAR | Status: AC
  Filled 2015-02-09: qty 2

## 2015-02-09 MED ORDER — SODIUM CHLORIDE 0.9 % IJ SOLN
INTRAMUSCULAR | Status: AC
  Filled 2015-02-09: qty 50

## 2015-02-09 MED ORDER — BUTORPHANOL TARTRATE 1 MG/ML IJ SOLN: 1.0000 mg | Freq: Four times a day (QID) | INTRAMUSCULAR | Status: DC | PRN

## 2015-02-09 MED ORDER — NALOXONE HCL 0.4 MG/ML IJ SOLN
0.4000 mg | INTRAMUSCULAR | Status: DC | PRN
  Administered 2015-02-09: 0.4 mg via INTRAVENOUS
  Filled 2015-02-09: qty 1

## 2015-02-09 MED ORDER — MORPHINE SULFATE 2 MG/ML IV SOLN
INTRAVENOUS | Status: DC
  Administered 2015-02-09 (×2): via INTRAVENOUS
  Filled 2015-02-09: qty 25

## 2015-02-09 MED ORDER — DIPHENHYDRAMINE HCL 50 MG/ML IJ SOLN: 12.5000 mg | Freq: Four times a day (QID) | INTRAMUSCULAR | Status: DC | PRN

## 2015-02-09 MED ORDER — VASOPRESSIN 20 UNIT/ML IV SOLN
INTRAVENOUS | Status: AC
  Filled 2015-02-09: qty 1

## 2015-02-09 MED ORDER — DIPHENHYDRAMINE HCL 12.5 MG/5ML PO ELIX
12.5000 mg | ORAL_SOLUTION | Freq: Four times a day (QID) | ORAL | Status: DC | PRN
  Filled 2015-02-09: qty 5

## 2015-02-09 MED ORDER — NEOSTIGMINE METHYLSULFATE 10 MG/10ML IV SOLN
INTRAVENOUS | Status: AC
  Filled 2015-02-09: qty 1

## 2015-02-09 MED ORDER — BUPIVACAINE HCL (PF) 0.25 % IJ SOLN
INTRAMUSCULAR | Status: AC
  Filled 2015-02-09: qty 30

## 2015-02-09 MED ORDER — FENTANYL CITRATE (PF) 100 MCG/2ML IJ SOLN
INTRAMUSCULAR | Status: DC | PRN
  Administered 2015-02-09 (×5): 50 ug via INTRAVENOUS

## 2015-02-09 MED ORDER — 0.9 % SODIUM CHLORIDE (POUR BTL) OPTIME
TOPICAL | Status: DC | PRN
  Administered 2015-02-09: 1000 mL

## 2015-02-09 MED ORDER — LIDOCAINE HCL (CARDIAC) 20 MG/ML IV SOLN
INTRAVENOUS | Status: AC
  Filled 2015-02-09: qty 5

## 2015-02-09 MED ORDER — FENTANYL CITRATE (PF) 250 MCG/5ML IJ SOLN
INTRAMUSCULAR | Status: AC
  Filled 2015-02-09: qty 25

## 2015-02-09 MED ORDER — MIDAZOLAM HCL 2 MG/2ML IJ SOLN
INTRAMUSCULAR | Status: AC
  Filled 2015-02-09: qty 4

## 2015-02-09 MED ORDER — LACTATED RINGERS IV SOLN: INTRAVENOUS | Status: DC

## 2015-02-09 MED ORDER — ONDANSETRON HCL 4 MG/2ML IJ SOLN
4.0000 mg | Freq: Once | INTRAMUSCULAR | Status: DC | PRN
Start: 2015-02-09 — End: 2015-02-09

## 2015-02-09 MED ORDER — ONDANSETRON HCL 4 MG/2ML IJ SOLN: 4.0000 mg | Freq: Four times a day (QID) | INTRAMUSCULAR | Status: DC | PRN

## 2015-02-09 MED ORDER — SODIUM CHLORIDE 0.9 % IJ SOLN
INTRAMUSCULAR | Status: AC
  Administered 2015-02-10: 3 mL via INTRAVENOUS
  Filled 2015-02-09: qty 3

## 2015-02-09 MED ORDER — NEOSTIGMINE METHYLSULFATE 10 MG/10ML IV SOLN
INTRAVENOUS | Status: DC | PRN
  Administered 2015-02-09: 3 mg via INTRAVENOUS

## 2015-02-09 MED ORDER — DEXAMETHASONE SODIUM PHOSPHATE 4 MG/ML IJ SOLN
INTRAMUSCULAR | Status: DC | PRN
  Administered 2015-02-09: 4 mg via INTRAVENOUS

## 2015-02-09 MED ORDER — SODIUM CHLORIDE 0.9 % IV SOLN
Freq: Once | INTRAVENOUS | Status: AC
  Administered 2015-02-09: 12:00:00 via INTRAVENOUS

## 2015-02-09 MED ORDER — OXYCODONE-ACETAMINOPHEN 5-325 MG PO TABS: 1.0000 | ORAL_TABLET | Freq: Four times a day (QID) | ORAL | Status: DC | PRN

## 2015-02-09 MED ORDER — ONDANSETRON HCL 4 MG/2ML IJ SOLN
INTRAMUSCULAR | Status: DC | PRN
  Administered 2015-02-09: 4 mg via INTRAVENOUS

## 2015-02-09 MED ORDER — PHENYLEPHRINE 40 MCG/ML (10ML) SYRINGE FOR IV PUSH (FOR BLOOD PRESSURE SUPPORT)
PREFILLED_SYRINGE | INTRAVENOUS | Status: AC
  Filled 2015-02-09: qty 10

## 2015-02-09 MED ORDER — ONDANSETRON HCL 4 MG/2ML IJ SOLN
INTRAMUSCULAR | Status: AC
  Filled 2015-02-09: qty 2

## 2015-02-09 MED ORDER — PROPOFOL 10 MG/ML IV BOLUS
INTRAVENOUS | Status: DC | PRN
  Administered 2015-02-09: 100 mg via INTRAVENOUS

## 2015-02-09 SURGICAL SUPPLY — 50 items
BARRIER ADHS 3X4 INTERCEED (GAUZE/BANDAGES/DRESSINGS) ×3 IMPLANT
CANISTER SUCT 3000ML (MISCELLANEOUS) ×3 IMPLANT
CLOSURE WOUND 1/2 X4 (GAUZE/BANDAGES/DRESSINGS)
CLOTH BEACON ORANGE TIMEOUT ST (SAFETY) ×3 IMPLANT
DECANTER SPIKE VIAL GLASS SM (MISCELLANEOUS) IMPLANT
DRAPE CESAREAN BIRTH W POUCH (DRAPES) ×3 IMPLANT
DRAPE WARM FLUID 44X44 (DRAPE) IMPLANT
DRSG OPSITE POSTOP 4X10 (GAUZE/BANDAGES/DRESSINGS) ×3 IMPLANT
DRSG TELFA 3X8 NADH (GAUZE/BANDAGES/DRESSINGS) ×3 IMPLANT
DRSG XEROFORM 1X8 (GAUZE/BANDAGES/DRESSINGS) ×3 IMPLANT
DURAPREP 26ML APPLICATOR (WOUND CARE) ×3 IMPLANT
GAUZE SPONGE 4X4 16PLY XRAY LF (GAUZE/BANDAGES/DRESSINGS) IMPLANT
GLOVE BIOGEL PI IND STRL 7.0 (GLOVE) ×3 IMPLANT
GLOVE BIOGEL PI IND STRL 8 (GLOVE) ×1 IMPLANT
GLOVE BIOGEL PI INDICATOR 7.0 (GLOVE) ×6
GLOVE BIOGEL PI INDICATOR 8 (GLOVE) ×2
GLOVE ECLIPSE 7.5 STRL STRAW (GLOVE) ×6 IMPLANT
GOWN STRL REUS W/TWL LRG LVL3 (GOWN DISPOSABLE) ×9 IMPLANT
HEMOSTAT SURGICEL 2X3 (HEMOSTASIS) ×3 IMPLANT
HEMOSTAT SURGICEL 4X8 (HEMOSTASIS) ×6 IMPLANT
NEEDLE HYPO 22GX1.5 SAFETY (NEEDLE) IMPLANT
NS IRRIG 1000ML POUR BTL (IV SOLUTION) ×3 IMPLANT
PACK ABDOMINAL GYN (CUSTOM PROCEDURE TRAY) ×3 IMPLANT
PAD ABD 8X7 1/2 STERILE (GAUZE/BANDAGES/DRESSINGS) ×3 IMPLANT
PAD OB MATERNITY 4.3X12.25 (PERSONAL CARE ITEMS) ×3 IMPLANT
PENCIL SMOKE EVAC W/HOLSTER (ELECTROSURGICAL) ×3 IMPLANT
PROTECTOR NERVE ULNAR (MISCELLANEOUS) ×3 IMPLANT
RETRACTOR WND ALEXIS 25 LRG (MISCELLANEOUS) IMPLANT
RTRCTR WOUND ALEXIS 25CM LRG (MISCELLANEOUS)
SPONGE GAUZE 4X4 12PLY STER LF (GAUZE/BANDAGES/DRESSINGS) ×3 IMPLANT
SPONGE LAP 18X18 X RAY DECT (DISPOSABLE) ×6 IMPLANT
STAPLER VISISTAT 35W (STAPLE) ×3 IMPLANT
STRIP CLOSURE SKIN 1/2X4 (GAUZE/BANDAGES/DRESSINGS) IMPLANT
SUT CHROMIC 3 0 SH 27 (SUTURE) IMPLANT
SUT VIC AB 0 CT1 18XCR BRD8 (SUTURE) ×3 IMPLANT
SUT VIC AB 0 CT1 36 (SUTURE) IMPLANT
SUT VIC AB 0 CT1 8-18 (SUTURE) ×6
SUT VIC AB 1 CT1 18XBRD ANBCTR (SUTURE) IMPLANT
SUT VIC AB 1 CT1 8-18 (SUTURE)
SUT VIC AB 3-0 CT1 27 (SUTURE) ×4
SUT VIC AB 3-0 CT1 TAPERPNT 27 (SUTURE) ×2 IMPLANT
SUT VIC AB 3-0 SH 27 (SUTURE) ×2
SUT VIC AB 3-0 SH 27X BRD (SUTURE) ×1 IMPLANT
SUT VICRYL 0 TIES 12 18 (SUTURE) ×3 IMPLANT
SUT VICRYL 3 0 BR 18  UND (SUTURE)
SUT VICRYL 3 0 BR 18 UND (SUTURE) IMPLANT
SYR CONTROL 10ML LL (SYRINGE) IMPLANT
TOWEL OR 17X24 6PK STRL BLUE (TOWEL DISPOSABLE) ×6 IMPLANT
TRAY FOLEY CATH SILVER 14FR (SET/KITS/TRAYS/PACK) ×3 IMPLANT
WATER STERILE IRR 1000ML POUR (IV SOLUTION) ×3 IMPLANT

## 2015-02-09 NOTE — H&P (View-Only) (Signed)
Patient ID: Candace Mccormick, female   DOB: March 24, 1971, 44 y.o.   MRN: [id number]  Patient is a 44 year old gravida 4 para 3 Ab1 who was admitted last night as a result of heavy vaginal bleeding status post resectoscopic polypectomy November 5 as a result of prolapse uterine polyp. 4 para 3 Ab1 who was admitted last night as a result of heavy vaginal bleeding status post resectoscopic polypectomy November 5 as a result of prolapse uterine polyp. See admitting note which indicated the patient was hypotensive and had a hemoglobin down to 6.9. She was admitted for blood transfusion control of her bleeding with planned surgery later today. In the emergency department a 20 cc Foley catheter was placed inside the uterus in an effort to tamponade the bleeding site from previous resection. A bladder Foley catheter was placed to monitor urinary output. She was kept overnight in the adult ICU for close monitoring patient as well as pain control as a result of the catheters. She was receiving Stadol 1 mg every 4-6 hours when necessary. After the initial dose of 2 mg she had some difficulty breathing so we spaced out her Stadol to 1 mg every 6 on a when necessary basis and she has not needed any in the past 7 hours. Patient has received 2 units of packed red blood cell and her hemoglobin is 9.2.  Exam: Patient typically runs low blood pressures last blood pressure this morning was 94/52 heart rate has fluctuated between 65 and 70. Net I & O 803 cc's. Temperature 98.5 Gen. appearance: Patient alert oriented, complexion returning less pale then last night Lungs: Clear to auscultation rhonchus or wheezes Heart: Regular rate and rhythm approximately 70 bpm Abdomen: Midline scar noted from previous surgery, soft nontender positive bowel sounds Pelvic exam: Not done dry catheter in uterus and bladder Extremities: No edema no calf tenderness  Assessment/plan: Patient with postop bleeding status post resectoscopic polypectomy for prolapse uterine polyp 02/03/2015 with severe anemia as a result of acute blood loss hemoglobin had dropped down to 6.9 after 2 units of transfused diffuse packed  red blood cell of 9.2 normal electrolytes. Patient receiving third unit of packed red blood cell with plans to proceed with abdominal hysterectomy with bilateral salpingectomy for which patient was counseled with the use of translator last night in the emergency room. We'll remove the uterine catheter in the operating room.

## 2015-02-09 NOTE — Anesthesia Preprocedure Evaluation (Signed)
Anesthesia Evaluation  Patient identified by MRN, date of birth, ID band Patient awake    Reviewed: Allergy & Precautions, NPO status , Patient's Chart, lab work & pertinent test results  History of Anesthesia Complications Negative for: history of anesthetic complications  Airway Mallampati: III  TM Distance: >3 FB Neck ROM: Full    Dental no notable dental hx. (+) Dental Advisory Given   Pulmonary neg pulmonary ROS,    Pulmonary exam normal breath sounds clear to auscultation       Cardiovascular negative cardio ROS Normal cardiovascular exam Rhythm:Regular Rate:Normal     Neuro/Psych negative neurological ROS  negative psych ROS   GI/Hepatic negative GI ROS, Neg liver ROS,   Endo/Other  negative endocrine ROS  Renal/GU negative Renal ROS  negative genitourinary   Musculoskeletal negative musculoskeletal ROS (+)   Abdominal   Peds negative pediatric ROS (+)  Hematology  (+) anemia ,   Anesthesia Other Findings   Reproductive/Obstetrics negative OB ROS                             Anesthesia Physical Anesthesia Plan  ASA: II  Anesthesia Plan: General   Post-op Pain Management:    Induction: Intravenous  Airway Management Planned: Oral ETT  Additional Equipment:   Intra-op Plan:   Post-operative Plan: Extubation in OR and Possible Post-op intubation/ventilation  Informed Consent: I have reviewed the patients History and Physical, chart, labs and discussed the procedure including the risks, benefits and alternatives for the proposed anesthesia with the patient or authorized representative who has indicated his/her understanding and acceptance.   Dental advisory given  Plan Discussed with: CRNA  Anesthesia Plan Comments:         Anesthesia Quick Evaluation

## 2015-02-09 NOTE — Op Note (Signed)
Operative Note  02/08/2015 - 02/09/2015  4:45 PM  PATIENT:  Candace Mccormick  44 y.o. female  PRE-OPERATIVE DIAGNOSIS:  Vaginal Bleeding  POST-OPERATIVE DIAGNOSIS:  Vaginal Bleeding  PROCEDURE:  Procedure(s): Abdominal supracervical hysterectomy with bilateral salpingectomy   SURGEON:  Surgeon(s): Terrance Mass, MD  ANESTHESIA:   general  FINDINGS: Normal-size uterus with fundal subserosal myoma. Evidence of prior tubal ligation. Normal-appearing ovaries bilateral. Friable tissue  DESCRIPTION OF OPERATION: The patient was taken to the operating room where she underwent a successful general endotracheal anesthesia. A timeout was undertaken to outline the procedure to be performed which was an abdominal hysterectomy with bilateral salpingectomy. Patient's fourth unit of packed red blood cell was started. Her hemoglobin after 3 units was 10.3. She received 2 g of Cefotan prophylactically. Patient also had PAS stockings for DVT prophylaxis. The previously placed intrauterine Foley catheter for tamponade on was removed and the vagina was prepped and draped in usual sterile fashion as well as the abdomen. The Foley catheter was left in place for monitor urinary output. A Pfannenstiel skin incision was made 2 cm above the symphysis pubis. The incision was carried down for the skin to the subcutaneous tissue down to the level of the rectus fascia. A midline neck was made. The fascia was incised in a transverse fashion and the peritoneal cavity was entered cautiously. The O'Connor-O'Sullivan retractors were placed and the patient was placed in slight Trendelenburg. The proximal left utero-ovarian ligament/and proximal tubal was clamped with a Heaney clamp on both sides for traction. The left round ligament was identified and suture ligated with 0 Vicryl suture and transected. There was evidence of previous cesarean section that the bladder was scarred anteriorly or to the lower uterine segment.  Meticulous dissection to free the bladder peritoneum from the uterus was undertaken. The left ureter was identified. And a second Heaney clamp was placed adjacent to the first any clamp close to the uterus and incised with the Mayo scissors and the pedicle consisting of the left tube and ovary were free tied with 0 Vicryl suture followed by transfixation stitch. Similar procedure was carried out on the contralateral side. The bladder appeared edematous making it very hard to free it completely down to the level of the cervical vaginal junction. The cardinal and broad ligaments were clamped cut and suture ligated with 0 Vicryl suture. It was decided due to the edema of the bladder and unable to free it completely to the cervical vaginal region that it was decided to proceed with a supracervical hysterectomy. The uterus to the level of the internal cervical os was excised with the scalpel and the uterus was passed off the operative field. It was noted that the canal was dilated probably as a result of long-standing history of prolapsed uterine polyp. The internal cervical loss was cauterized with the Bovie in the event of any active endometrial gland and the stump was reapproximated with interrupted sutures of 0 Vicryl suture. Both left and right fallopian tubes were excised and the following fashion: A Kelly clamp was placed in the mesosalpinx and excised and passed the office operative field for histological evaluation. The nasal salpinx stop was reapproximated with a transfixation stitch of 0 Vicryl suture. Multiple sites of oozing and friability were individually cauterized and afterwards Surgicel was placed for additional hemostasis after the pelvic cavity was copiously irrigated with normal saline solution. The sponge count needle count were correct the Balfour retractor was removed and we attempted to inject Exparel  on the fascia for postoperative analgesia she continued to bleed easily so this was aborted.  Surgicel was also applied below the fascial closure for additional hemostasis. The fascia was reapproximated with a running stitch of 0 Vicryl suture. The subcutaneous bleeders were Bovie cauterized and the skin was reapproximated with skin clips followed by Xeroform gauze and a pressure dressing. Patient was extubated and transferred to recovery room with stable vital signs urine output approximately 250 cc.  The following labs were drawn during the time of the skin closure: CBC: Hemoglobin 9.6 platelet count 142,000 Fibrinogen: 174 slightly below normal PT 17.1 slightly above normal  INR: 1.38 normal  APTT: 37 normal  Cryoprecipitate was ordered to be administered in the recovery room before transferring patient to ICU.   ESTIMATED BLOOD LOSS: 400 cc  Intake/Output Summary (Last 24 hours) at 02/09/15 1645 Last data filed at 02/09/15 1600  Gross per 24 hour  Intake 5468.01 ml  Output   3475 ml  Net 1993.01 ml     BLOOD ADMINISTERED:Patient's fourth unit of packed red blood cell was administered at the start of the case CC PRBC   LOCAL MEDICATIONS USED:  NONE  SPECIMEN:  Source of Specimen:  Uterus, bilateral fallopian tubes  DISPOSITION OF SPECIMEN:  PATHOLOGY  COUNTS:  YES  PLAN OF CARE: Transfer to [place] HMD4:45 PMTD@

## 2015-02-09 NOTE — Transfer of Care (Signed)
Immediate Anesthesia Transfer of Care Note  Patient: Candace Mccormick  Procedure(s) Performed: Procedure(s): HYSTERECTOMY ABDOMINAL WITH BILATERAL SALPINGECTOMY (N/A)  Patient Location: PACU  Anesthesia Type:General  Level of Consciousness: awake, alert  and oriented  Airway & Oxygen Therapy: Patient Spontanous Breathing and Patient connected to nasal cannula oxygen  Post-op Assessment: Report given to RN and Post -op Vital signs reviewed and stable  Post vital signs: Reviewed and stable  Last Vitals:  Filed Vitals:   02/09/15 1217  BP: 87/56  Pulse: 78  Temp: 37 C  Resp: 16    Complications: No apparent anesthesia complications

## 2015-02-09 NOTE — Telephone Encounter (Signed)
I called and spoke with [facility].  She already had prior auth on file for the Emergency admission/urgent surgery for this patient. Patient was admitted 02/08/15. TAH, Bilateral Salpingectomy is planned for 1:00pm today per Dr. Moshe Salisbury.

## 2015-02-09 NOTE — Progress Notes (Signed)
Patient ID: Candace Mccormick, female   DOB: May 26, 1970, 44 y.o.   MRN: [id number]  Patient is a 44 year old gravida 4 para 3 Ab1 who was admitted last night as a result of heavy vaginal bleeding status post resectoscopic polypectomy November 5 as a result of prolapse uterine polyp. See admitting note which indicated the patient was hypotensive and had a hemoglobin down to 6.9. She was admitted for blood transfusion control of her bleeding with planned surgery later today. In the emergency department a 20 cc Foley catheter was placed inside the uterus in an effort to tamponade the bleeding site from previous resection. A bladder Foley catheter was placed to monitor urinary output. She was kept overnight in the adult ICU for close monitoring patient as well as pain control as a result of the catheters. She was receiving Stadol 1 mg every 4-6 hours when necessary. After the initial dose of 2 mg she had some difficulty breathing so we spaced out her Stadol to 1 mg every 6 on a when necessary basis and she has not needed any in the past 7 hours. Patient has received 2 units of packed red blood cell and her hemoglobin is 9.2.  Exam: Patient typically runs low blood pressures last blood pressure this morning was 94/52 heart rate has fluctuated between 65 and 70. Net I & O 803 cc's. Temperature 98.5 Gen. appearance: Patient alert oriented, complexion returning less pale then last night Lungs: Clear to auscultation rhonchus or wheezes Heart: Regular rate and rhythm approximately 70 bpm Abdomen: Midline scar noted from previous surgery, soft nontender positive bowel sounds Pelvic exam: Not done dry catheter in uterus and bladder Extremities: No edema no calf tenderness  Assessment/plan: Patient with postop bleeding status post resectoscopic polypectomy for prolapse uterine polyp 02/03/2015 with severe anemia as a result of acute blood loss hemoglobin had dropped down to 6.9 after 2 units of transfused diffuse packed  red blood cell of 9.2 normal electrolytes. Patient receiving third unit of packed red blood cell with plans to proceed with abdominal hysterectomy with bilateral salpingectomy for which patient was counseled with the use of translator last night in the emergency room. We'll remove the uterine catheter in the operating room.

## 2015-02-09 NOTE — Progress Notes (Signed)
Pt transferred via bed to the OR. 1 unit PRBC's (just received) to be transfused, Exparel, & Cefotan taken with pt & given to Dr Jillyn Hidden.

## 2015-02-09 NOTE — Progress Notes (Signed)
Plan of Care discussed with patient & spouse including timing of next lab draw & hysterectomy; CHG bath, continuing to be NPO. Pts questions answered. Total time with [facility] [phone] about 15 min.

## 2015-02-09 NOTE — Anesthesia Postprocedure Evaluation (Signed)
  Anesthesia Post-op Note  Patient: Candace Mccormick  Procedure(s) Performed: Procedure(s) (LRB): HYSTERECTOMY ABDOMINAL WITH BILATERAL SALPINGECTOMY (N/A)  Patient Location: PACU  Anesthesia Type: General  Level of Consciousness: awake and alert   Airway and Oxygen Therapy: Patient Spontanous Breathing  Post-op Pain: mild  Post-op Assessment: Post-op Vital signs reviewed, Patient's Cardiovascular Status Stable, Respiratory Function Stable, Patent Airway and No signs of Nausea or vomiting  Last Vitals:  Filed Vitals:   02/09/15 1217  BP: 87/56  Pulse: 78  Temp: 37 C  Resp: 16    Post-op Vital Signs: stable   Complications: No apparent anesthesia complications. Dr. Toney Rakes to give one unit of cryo given fibrinogen and to recheck labs at 8pm.

## 2015-02-09 NOTE — Addendum Note (Signed)
Addendum  created 02/09/15 1946 by Lyn Hollingshead, MD   Modules edited: Orders, PRL Based Order Sets

## 2015-02-09 NOTE — Interval H&P Note (Signed)
History and Physical Interval Note:  02/09/2015 11:29 AM  Candace Mccormick  has presented today for surgery, with the diagnosis of Vaginal Bleeding  The various methods of treatment have been discussed with the patient and family. After consideration of risks, benefits and other options for treatment, the patient has consented to  Procedure(s): HYSTERECTOMY ABDOMINAL (N/A) as a surgical intervention .  The patient's history has been reviewed, patient examined, no change in status, stable for surgery.  I have reviewed the patient's chart and labs.  Questions were answered to the patient's satisfaction.     Terrance Mass

## 2015-02-09 NOTE — Progress Notes (Signed)
Pt drowsy but easily aroused & follows commands, but unable to keep RR 10 or >/ETCO2= 47-48 without frequent stimulation. Narcan 0.4mg  in 9cc NS given IVP. RR improved up to 16-18 w/ETCO2 of 42. Dr Jillyn Hidden, MDA notified.

## 2015-02-09 NOTE — Progress Notes (Signed)
Patient ID: Candace Mccormick, female   DOB: [birth year], 44 y.o.   MRN: [id number] As previous note indicated patient was kept in the ICU overnight prior to her schedule abdominal hysterectomy with bilateral salpingectomy today at 1 PM. Patient has received 3 units of packed red blood cell her hemoglobin has increased from 6.9 up to 10.3. No vaginal bleeding noted since the intrauterine catheter is in place. I've asked the nurse to obtain orthostatic vital signs for me for the possibility of transfusing her fourth unit of packed red blood cells in route to the operating room. Vital signs as follows:  Lying down  blood pressure 100/47  pulse 70 Sitting blood pressure 80/49 (patient felt dizzy) pulse 80            Repeat blood pressure 92/47 pulse 71 Standing blood pressure 80/58 with a pulse of 85 After standing for 3 minutes blood pressure 100/62 with a pulse of 78  Patient orthostatic with dizziness upon changing position from supine to erect position with low blood pressures although with normal pulse. Patient will be transfused her fourth unit of packed red blood cell in route to the operating room. Prior to her first unit of she was counseled through the use of an interpreter potential risk from blood transfusion by receiving blood from donor to recipient to include adverse anaphylactic reaction to hepatitis as well as AIDS. Also we went over the operation total abdominal hysterectomy with bilateral salpingectomy) to include the following risk:                        Patient was counseled as to the risk of surgery to include the following:  1. Infection (prohylactic antibiotics will be administered)  2. DVT/Pulmonary Embolism (prophylactic pneumo compression stockings will be used)  3.Trauma to internal organs requiring additional surgical procedure to repair any injury to     Internal organs requiring perhaps additional hospitalization days.  4.Hemmorhage requiring transfusion and blood products  which carry risks such as             anaphylactic reaction, hepatitis and AIDS  Patient had received literature information on the procedure scheduled and all her questions were answered and fully accepts all risk.  All questions were answered and we'll follow accordingly.   [facility] HMD11:54 AMTD@Note :

## 2015-02-09 NOTE — Progress Notes (Signed)
Pt c/o discomfort in right eye - "feels like something is in it". Dr Jillyn Hidden, MDA notified. Orders to be placed by MD. Report given to night shift RN.

## 2015-02-10 ENCOUNTER — Encounter (HOSPITAL_COMMUNITY): Payer: Self-pay | Admitting: Family Medicine

## 2015-02-10 LAB — BASIC METABOLIC PANEL
ANION GAP: 5 (ref 5–15)
BUN: 5 mg/dL — ABNORMAL LOW (ref 6–20)
CALCIUM: 7.4 mg/dL — AB (ref 8.9–10.3)
CO2: 25 mmol/L (ref 22–32)
CREATININE: 0.52 mg/dL (ref 0.44–1.00)
Chloride: 107 mmol/L (ref 101–111)
Glucose, Bld: 101 mg/dL — ABNORMAL HIGH (ref 65–99)
Potassium: 3.7 mmol/L (ref 3.5–5.1)
SODIUM: 137 mmol/L (ref 135–145)

## 2015-02-10 LAB — CBC
HCT: 25.8 % — ABNORMAL LOW (ref 36.0–46.0)
Hemoglobin: 8.9 g/dL — ABNORMAL LOW (ref 12.0–15.0)
MCH: 30.4 pg (ref 26.0–34.0)
MCHC: 34.5 g/dL (ref 30.0–36.0)
MCV: 88.1 fL (ref 78.0–100.0)
PLATELETS: 158 10*3/uL (ref 150–400)
RBC: 2.93 MIL/uL — AB (ref 3.87–5.11)
RDW: 18.4 % — AB (ref 11.5–15.5)
WBC: 17.4 10*3/uL — AB (ref 4.0–10.5)

## 2015-02-10 LAB — URINALYSIS, ROUTINE W REFLEX MICROSCOPIC
Bilirubin Urine: NEGATIVE
GLUCOSE, UA: NEGATIVE mg/dL
KETONES UR: NEGATIVE mg/dL
Nitrite: NEGATIVE
PH: 6 (ref 5.0–8.0)
PROTEIN: NEGATIVE mg/dL
Specific Gravity, Urine: <1.005 — ABNORMAL LOW (ref 1.005–1.030)
Urobilinogen, UA: 0.2 mg/dL (ref 0.0–1.0)

## 2015-02-10 LAB — PREPARE CRYOPRECIPITATE: Unit division: 0

## 2015-02-10 LAB — PROTIME-INR
INR: 1.36 (ref 0.00–1.49)
Prothrombin Time: 16.9 seconds — ABNORMAL HIGH (ref 11.6–15.2)

## 2015-02-10 LAB — URINE MICROSCOPIC-ADD ON

## 2015-02-10 LAB — FIBRINOGEN: FIBRINOGEN: 355 mg/dL (ref 204–475)

## 2015-02-10 MED ORDER — ENSURE ENLIVE PO LIQD
237.0000 mL | Freq: Two times a day (BID) | ORAL | Status: DC
  Administered 2015-02-10 – 2015-02-11 (×2): 237 mL via ORAL
  Filled 2015-02-10 (×3): qty 237

## 2015-02-10 MED ORDER — DOCUSATE SODIUM 100 MG PO CAPS
100.0000 mg | ORAL_CAPSULE | Freq: Every day | ORAL | Status: DC | PRN
  Administered 2015-02-10: 100 mg via ORAL
  Filled 2015-02-10: qty 1

## 2015-02-10 MED ORDER — DEXTROSE 5 % IV SOLN: 1.0000 g | Freq: Once | INTRAVENOUS | Status: DC

## 2015-02-10 MED ORDER — FLEET ENEMA 7-19 GM/118ML RE ENEM
1.0000 | ENEMA | Freq: Once | RECTAL | Status: AC
  Administered 2015-02-10: 1 via RECTAL

## 2015-02-10 MED ORDER — METOCLOPRAMIDE HCL 10 MG PO TABS: 10.0000 mg | ORAL_TABLET | Freq: Four times a day (QID) | ORAL | Status: DC | PRN

## 2015-02-10 MED ORDER — SODIUM CHLORIDE 0.9 % IJ SOLN
3.0000 mL | Freq: Two times a day (BID) | INTRAMUSCULAR | Status: DC
  Administered 2015-02-10: 3 mL via INTRAVENOUS

## 2015-02-10 MED ORDER — BISACODYL 10 MG RE SUPP
10.0000 mg | Freq: Once | RECTAL | Status: AC
  Administered 2015-02-10: 10 mg via RECTAL
  Filled 2015-02-10: qty 1

## 2015-02-10 MED ORDER — DEXTROSE 5 % IV SOLN
2.0000 g | Freq: Once | INTRAVENOUS | Status: AC
  Administered 2015-02-10: 2 g via INTRAVENOUS
  Filled 2015-02-10: qty 2

## 2015-02-10 MED ORDER — METOCLOPRAMIDE HCL 5 MG/ML IJ SOLN: 10.0000 mg | Freq: Four times a day (QID) | INTRAMUSCULAR | Status: DC

## 2015-02-10 MED ORDER — ERYTHROMYCIN 5 MG/GM OP OINT
TOPICAL_OINTMENT | Freq: Three times a day (TID) | OPHTHALMIC | Status: DC
  Administered 2015-02-10 (×3): 1 via OPHTHALMIC
  Administered 2015-02-10: 15:00:00 via OPHTHALMIC
  Administered 2015-02-11: 1 via OPHTHALMIC
  Filled 2015-02-10: qty 1
  Filled 2015-02-10 (×2): qty 3.5
  Filled 2015-02-10: qty 1

## 2015-02-10 MED ORDER — SODIUM CHLORIDE 0.9 % IV BOLUS (SEPSIS)
500.0000 mL | Freq: Once | INTRAVENOUS | Status: AC
  Administered 2015-02-10: 500 mL via INTRAVENOUS

## 2015-02-10 NOTE — Progress Notes (Signed)
Pt voided small amount in toilet. C/o pelvic discomfort. Bladder scan 530 ml. Dr. Toney Rakes notified orders given for an in & out cath. Pt c/o rectal pain requesting enema. Orders given. Will continue to monitor.

## 2015-02-10 NOTE — Consult Note (Signed)
[facility] Consult Note  Patient Name: Candace Mccormick     [id number]    DOB: May 02, 1970    DOA: 02/08/2015 Referring physician: Uvaldo Rising, MD PCP: No PCP Per Patient     Reason for consult: Oliguria  HPI: Candace Mccormick is a 44 y.o. female with a past medical history significant for uterine polyp whom we are asked to see in consultation regarding oliguria and fluid management in the context of marked blood loss anemia.   The patient has a history some years ago of uterine polyps causing menorrhagia requiring transfusion, but is otherwise healthy.  This fall, she had recurrence of her bleeding, and was seen in the Dept of OB/Gyn for recurrent uterine polyp, which was removed hysteroscopically and uneventfully by Dr. Toney Mccormick on Nov. 5th.    The patient had persistent bleeding after discharge until yesterday night when she presented to [facility] pale and anemic to 6.9 g/dL with ongoing bleeding and soft blood pressure.  She was admitted overnight to the ICU at [facility] and transfused 3U PRBCs.  Today, during the day, the patient was taken back to the OR by Dr. Toney Mccormick for a total abdominal hysterectomy.  There was reportedly oozing noted during the procedure and CBC/coags post-operatively showed normal INR and aPTT and improved hemoglobin, but slightly low platelet count and fibrinogen level.  On this basis, the case was discussed by phone with Hematology and cryoprecipitate was given and the patient was transferred to the ICU.  In the ICU, the patient was recovering comfortably.  She was noted to have slowing urine output post-operatively and there was concern about administering additional fluids given a recorded fluid balance of 2.8L since admission.    The patient describes to me through a telephonic interpreter that she was she had vaginal bleeding and weakness before presentation, but no fever, chills, malaise, cough, sputum, abdominal discomfort, or dysuria.  She  complains to me of pain now at her incision site, low back pain, and irritation in the left > right eye, all of which are moderate in intensity.        Review of Systems:  Pt complains of incision pain, low back pain, eye irritation. Pt denies any fever, chills, abdominal discomfort, bloating, nausea, dyspnea, cough, chest discomfort.  All other systems negative except as just noted or noted in the history of present illness.   Allergies: No Known Allergies  Home medications: None  Past medical history: 1. Uterine polyp 2. Anemia from menorrhagia/blood loss  Past surgical history: 1. Polypectomy 2. C-section twice  Family history:  Mother, diabetes. Father, hypertension. No family history of bleeding or renal disease that she knows of.   Social History:  Patient lives with her husband.  She does not work but is functional, ambulatory and independent with all ADLs and IADLs.  She does not smoker or use alcohol.        Physical Exam: BP 99/54 mmHg  Pulse 67  Temp(Src) 98.7 F (37.1 C) (Oral)  Resp 13  Ht 4\' 7"  (1.397 m)  Wt 53.207 kg (117 lb 4.8 oz)  BMI 27.26 kg/m2  SpO2 100%  LMP 01/28/2015 General appearance: Well-developed, adult female, eyes closed but alert and in moderate distress from pain.   Eyes: Anicteric, conjunctiva injected bilaterally without obvious foreign body, lids and lashes normal, no discharge.     ENT: No nasal deformity, discharge, or epistaxis.  OP moist without lesions.   Lymph: No cervical, supraclavicular lymphadenopathy. Skin: Warm and  dry.  No jaundice.  No suspicious rashes or lesions. Cardiac: RRR, nl S1-S2, no murmurs appreciated.  Capillary refill is brisk.  There is mild non-pitting edema of arms and legs.  Radial pulses 2+ and symmetric. Respiratory: Normal respiratory rate and rhythm.  CTAB without rales. Abdomen: Abdomen soft.  Incision dressing in place, and clean.  There is mild TTP in all quadrants, without focal pain or  guarding. No ascites, distension.   Neuro: Sensorium intact and responding to questions, attention normal, but keeps eyes closed due to discomfort.  Interpreter comprehends all patient utterances.  Moves all extremities equally and with normal coordination, limited by pain.    Psych: Behavior appropriate.  Affect normal.  No evidence of aural or visual hallucinations or delusions.       Labs on Admission:  The metabolic panel shows normal sodium, potassium, bicarbonate, and renal function The transaminases and bilirubin are normal. The INR is 1.38 The PTT is 37 The fibrinogen is slightly low at 174. The complete blood count shows WBC 21.3K/uL, anemia improved to 10.4 g/dL.  Platelets up to 172K/uL.     Assessment/Plan 1. Oliguria:  The patient's urine output during the 4 hours after surgery appears to have been dark and around 30 cc/hr.  The preoperative renal function was normal.  Clinically, she is minimally fluid overloaded and her lung exam and oxygenation are fine and continued fluid bolus for her low normal urine output would be reasonable.   -NS bolus 500 cc now -Continue maintenance LR at 125 cc/hr  -Repeat BMP -Bladder scan when able, if markedly large bladder, flush foley and consider renal US -If creatinine rises, will check renal US and urine electrolytes, but for the meantime, goal UOP >30 cc/hr     2. ?DIC:  The question of DIC was raised by low normal fibrinogen and platelets with clinical oozing during surgery in this obstetric patient.  No clinical evidence of bleeding/oozing or anticoagulation at this time. -Trend CBC and fibrinogen and INR -Defer to surgeon's typical DVT prophylaxis (ted hose it appears)  3. Leukocytosis:  The cause is unclear, and this was present on admission 24 hours ago.  No source of infection on my exam or by history.  Currently afebrile with Tmax only 100.59F -Trend CBC -Low threshold to obtain blood cultures and CXR  4. Eye irritation:   Likely corneal abrasion, which should within 24-48 hours. -Analgesia per primary team -Agree with erythromycin ointment applied qid      Thank you for this consult, and [facility] will follow along as needed.      Edwin Dada [facility] Pager [phone]

## 2015-02-10 NOTE — Progress Notes (Signed)
Patient ID: Candace Mccormick, female   DOB: 1970-06-17, 44 y.o.   MRN: [id number]  [facility] PROGRESS NOTE  Candace Mccormick [id number] DOB: 03-10-1971 DOA: 02/08/2015 PCP: No PCP Per Patient   Brief narrative:    44 y.o. female with a past medical history significant for uterine polyp whom we are asked to see in consultation regarding oliguria and fluid management in the context of marked blood loss anemia.   The patient has a history some years ago of uterine polyps causing menorrhagia requiring transfusion, but is otherwise healthy. This fall, she had recurrence of her bleeding, and was seen in the Dept of OB/Gyn for recurrent uterine polyp, which was removed hysteroscopically and uneventfully by Dr. Toney Rakes on Nov. 5th.   The patient had persistent bleeding after discharge and she presented to [facility] pale and anemic to 6.9 g/dL with ongoing bleeding and soft blood pressure. She was admitted overnight to the ICU at [facility] and transfused 3U PRBCs.   In the ICU, the patient was recovering comfortably. She was noted to have slowing urine output post-operatively and there was concern about administering additional fluids given a recorded fluid balance of 2.8L since admission.   Assessment/Plan:    Active Problems: Oliguria - improving  - plan to take foley out - OK to give IVF boluses if needed to improve UOP - Cr is stable and WNL - BMP in AM  Fever - post op - UA and urine culture requested  Will sign off for now, please call me with additional questions.   Code Status: Full.  Family Communication:  plan of care discussed with the patient Disposition Plan: per primary team  IV access:  Peripheral IV  Procedures and diagnostic studies:    US Pelvis Complete  01/24/2015  Ultrasound shows uterus generous in size with 24 mm subserosal myoma. Endometrial echo 14 mm. Right and left ovaries grossly normal with physiologic changes. Cul-de-sac with 41 x 31  mm fluid. Sonohysterogram performed, sterile technique, easy catheter introduction, good distention with large fingerlike polyp 58 x 9 x 13 mm extending from the fundus to the cervical canal. Endometrial sample taken. Patient tolerated well  Ct Abdomen Pelvis W Contrast  02/03/2015  CLINICAL DATA:  44 year old female with history of menorrhagia, status post cervical polypectomy in October 2015. Increased bleeding. EXAM: CT ABDOMEN AND PELVIS WITH CONTRAST TECHNIQUE: Multidetector CT imaging of the abdomen and pelvis was performed using the standard protocol following bolus administration of intravenous contrast. CONTRAST:  155mL OMNIPAQUE IOHEXOL 300 MG/ML  SOLN COMPARISON:  Pelvic ultrasound 01/17/2015. FINDINGS: Lower chest:  Unremarkable. Hepatobiliary: Sub cm low-attenuation lesion in segment 7 of the liver is too small to definitively characterize, but is statistically likely a tiny cyst. No other larger more suspicious appearing cystic or solid hepatic lesions are noted. No intra or extrahepatic biliary ductal dilatation. Gallbladder is normal in appearance. Pancreas: No pancreatic mass. No pancreatic ductal dilatation. No pancreatic or peripancreatic fluid or inflammatory changes. Spleen: Unremarkable. Adrenals/Urinary Tract: Sub cm low-attenuation lesion in the lower pole of the right kidney is too small to definitively characterize, but is statistically likely a tiny cyst. Left kidney is normal in appearance. No hydroureteronephrosis. Urinary bladder is normal in appearance. Bilateral adrenal glands are normal in appearance. Stomach/Bowel: Normal appearance of the stomach. No pathologic dilatation of small bowel or colon. Normal appendix. Vascular/Lymphatic: No significant atherosclerotic disease, aneurysm or dissection identified in the abdominal or pelvic vasculature. No lymphadenopathy noted in the abdomen or pelvis. Reproductive:  Sagittal image 84 of series 6 demonstrates what appears to be a long  finger-like polyp which appears to be attached near the fundal portion of the endometrial canal along the right posterior wall of the uterus, and appears to be partially prolapsing out the cervical os. This measures 7.4 x 1.5 x 1.5 cm. There is fluid in the endometrial canal, likely blood. Additionally, there is fluid in the vagina. There is also a 2.2 cm heterogeneously enhancing lesion extending off the anterior wall of the body of the uterus on the right side, likely to represent a fibroid. Ovaries are unremarkable in appearance. Other: No significant volume of ascites.  No pneumoperitoneum. Musculoskeletal: There are no aggressive appearing lytic or blastic lesions noted in the visualized portions of the skeleton. IMPRESSION: 1. Large pedunculated endometrial polyp which extends a length of approximately 7.4 cm, and is partially prolapsing out the cervical os. There is fluid in the endometrial canal and vagina, which appears to be blood. OB-Gyn referral for polypectomy is recommended. 2. 2.2 cm heterogeneously enhancing lesion in the right side of the uterine body is most likely to represent a small fibroid. 3. No other acute findings in the abdomen or pelvis. 4. Normal appendix. 5. Additional incidental findings, as above. Electronically Signed   By: Vinnie Langton M.D.   On: 02/03/2015 15:51   [place] Sonohysterogram  01/24/2015  Ultrasound shows uterus generous in size with 24 mm subserosal myoma. Endometrial echo 14 mm. Right and left ovaries grossly normal with physiologic changes. Cul-de-sac with 41 x 31 mm fluid. Sonohysterogram performed, sterile technique, easy catheter introduction, good distention with large fingerlike polyp 58 x 9 x 13 mm extending from the fundus to the cervical canal. Endometrial sample taken. Patient tolerated well   Faye Ramsay, MD  [facility] Pager [phone]  If 7PM-7AM, please contact night-coverage [email or web address] Password [email or web address] 02/10/2015, 11:28 AM   LOS: 2 days    HPI/Subjective: No events overnight.   Objective: Filed Vitals:   02/10/15 0700 02/10/15 0800 02/10/15 1000 02/10/15 1029  BP: 97/47 98/50 98/48    Pulse: 60 79 79   Temp:  98.1 F (36.7 C)    TempSrc:  Oral    Resp:  19 18 19   Height:      Weight:      SpO2: 100% 98% 98% 98%    Intake/Output Summary (Last 24 hours) at 02/10/15 1128 Last data filed at 02/10/15 1100  Gross per 24 hour  Intake 5670.75 ml  Output   2880 ml  Net 2790.75 ml    Exam:   General:  Pt is alert, follows commands appropriately, not in acute distress  Cardiovascular: Regular rate and rhythm, S1/S2, no murmurs, no rubs, no gallops  Respiratory: Clear to auscultation bilaterally, no wheezing, no crackles, no rhonchi  Abdomen: Soft, non tender, non distended, bowel sounds present, no guarding  Extremities: No edema, pulses DP and PT palpable bilaterally  Neuro: Grossly nonfocal  Data Reviewed: Basic Metabolic Panel:  Recent Labs Lab 02/03/15 1156 02/09/15 0330 02/10/15 0515  NA 139 137 137  K 4.3 3.7 3.7  CL 104 109 107  CO2  --  25 25  GLUCOSE 92 130* 101*  BUN 11 9 5*  CREATININE 0.60 0.43* 0.52  CALCIUM  --  7.7* 7.4*   Liver Function Tests:  Recent Labs Lab 02/09/15 0330  AST 13*  ALT 12*  ALKPHOS 34*  BILITOT 0.8  PROT 4.8*  ALBUMIN 2.7*   No results for input(s): LIPASE,  AMYLASE in the last 168 hours. No results for input(s): AMMONIA in the last 168 hours. CBC:  Recent Labs Lab 02/09/15 0330 02/09/15 1016 02/09/15 1447 02/09/15 2000 02/10/15 0515  WBC 21.2* 22.2* 26.7* 21.3* 17.4*  HGB 9.2* 10.3* 9.6* 10.4* 8.9*  HCT 26.3* 29.5* 27.9* 30.3* 25.8*  MCV 92.3 90.2 88.0 87.3 88.1  PLT 190 188 142* 172 158   Cardiac Enzymes: No results for input(s): CKTOTAL, CKMB, CKMBINDEX, TROPONINI in the last 168 hours. BNP: Invalid input(s): POCBNP CBG: No results for input(s): GLUCAP in the last 168 hours.  Recent Results (from the past 240 hour(s))  MRSA PCR  Screening     Status: None   Collection Time: 02/08/15  8:00 PM  Result Value Ref Range Status   MRSA by PCR NEGATIVE NEGATIVE Final    Comment:        The GeneXpert MRSA Assay (FDA approved for NASAL specimens only), is one component of a comprehensive MRSA colonization surveillance program. It is not intended to diagnose MRSA infection nor to guide or monitor treatment for MRSA infections.      Scheduled Meds: . cefoTEtan (CEFOTAN) IV  2 g Intravenous Once  . erythromycin   Both Eyes 3 times per day  . Influenza vac split quadrivalent PF  0.5 mL Intramuscular Tomorrow-1000  . morphine   Intravenous 6 times per day   Continuous Infusions: . lactated ringers 125 mL/hr at 02/10/15 0145

## 2015-02-10 NOTE — Anesthesia Postprocedure Evaluation (Signed)
  Anesthesia Post-op Note  Patient: Candace Mccormick  Procedure(s) Performed: Procedure(s): HYSTERECTOMY ABDOMINAL WITH BILATERAL SALPINGECTOMY (N/A)  Patient Location: ICU  Anesthesia Type:General  Level of Consciousness: awake, alert  and oriented  Airway and Oxygen Therapy: Patient Spontanous Breathing and Patient connected to nasal cannula oxygen  Post-op Pain: none  Post-op Assessment: Post-op Vital signs reviewed, Patient's Cardiovascular Status Stable, Respiratory Function Stable, Patent Airway, No signs of Nausea or vomiting, Adequate PO intake and Pain level controlled              Post-op Vital Signs: Reviewed and stable  Last Vitals:  Filed Vitals:   02/10/15 0800  BP: 98/50  Pulse: 79  Temp: 36.7 C  Resp: 19    Complications: No apparent anesthesia complications

## 2015-02-10 NOTE — Progress Notes (Signed)
1 Day Post-Op Procedure(s) (LRB): HYSTERECTOMY ABDOMINAL WITH BILATERAL SALPINGECTOMY (N/A)  Subjective: Patient reports tolerating PO, + flatus and no problems voiding.    Objective: I have reviewed patient's vital signs, intake and output, medications and labs.  Blood pressure 103/54, temperature 98.8 O2 sats in room air 97% General: alert and cooperative Resp: clear to auscultation bilaterally Cardio: regular rate and rhythm, S1, S2 normal, no murmur, click, rub or gallop GI: soft, non-tender; bowel sounds normal; no masses,  no organomegaly Extremities: extremities normal, atraumatic, no cyanosis or edema Vaginal Bleeding: none  Assessment: s/p Procedure(s): HYSTERECTOMY ABDOMINAL WITH BILATERAL SALPINGECTOMY (N/A): stable, tolerating diet and Ambulating and hungry. Patient complaining of constipation. Patient has voided twice since Foley was removed.  Plan: #1 Hep-Lock IV #2 transferred to floor #3 advanced to regular diet #4 ambulate #5 patient may shower tonight #6 Dulcolax suppository before transfer to floor #7 Colace 1 by mouth daily #8 CBC in the morning #9 by mouth Percocet and Reglan when necessary pain or nausea or vomiting #10 plan discharge home tomorrow    LOS: 2 days    [facility] H 02/10/2015, 4:06 PM

## 2015-02-10 NOTE — Progress Notes (Signed)
1 Day Post-Op Procedure(s) (LRB): HYSTERECTOMY ABDOMINAL WITH BILATERAL SALPINGECTOMY (N/A)  Subjective: Patient reports tolerating PO and + flatus.    Objective: I have reviewed patient's vital signs, intake and output, medications and labs.d BP 104/60   Pulse 80  Respiration: 19 General: alert and flushed Resp: clear to auscultation bilaterally Cardio: regular rate and rhythm, S1, S2 normal, no murmur, click, rub or gallop GI: soft, non-tender; bowel sounds normal; no masses,  no organomegaly and incision: clean, dry and intact Extremities: extremities normal, atraumatic, no cyanosis or edema Vaginal Bleeding: scant on pad   I/0's: Net since admission: +2628 cc (Past 12 hours: 1005 cc/80 cc/hour) Temp: 98.7 02 Sat Room Air 98 %  Labs this am: WBC 17.4 down from 21.3 HgB: 8.9 (Preop 9.6 was 10.4 after  4th unit PRBC given intraop) Platelet: 158K Coags: Results for OZA, OBERLE (MRN [id number]) as of 02/10/2015 07:44  Ref. Range 02/10/2015 05:15  Fibrinogen Latest Ref Range: 204-475 mg/dL 355  Prothrombin Time Latest Ref Range: 11.6-15.2 seconds 16.9 (H)  INR Latest Ref Range: 0.00-1.49  1.36   Electrolytes: Results for EDONA, SCHREFFLER (MRN [id number]) as of 02/10/2015 07:44  Ref. Range 02/10/2015 05:15  Sodium Latest Ref Range: 135-145 mmol/L 137  Potassium Latest Ref Range: 3.5-5.1 mmol/L 3.7  Chloride Latest Ref Range: 101-111 mmol/L 107  CO2 Latest Ref Range: 22-32 mmol/L 25  BUN Latest Ref Range: 6-20 mg/dL 5 (L)  Creatinine Latest Ref Range: 0.44-1.00 mg/dL 0.52  Calcium Latest Ref Range: 8.9-10.3 mg/dL 7.4 (L)  EGFR (Non-African Amer.) Latest Ref Range: >60 mL/min >60  EGFR (African American) Latest Ref Range: >60 mL/min >60  Glucose Latest Ref Range: 65-99 mg/dL 101 (H)  Anion gap Latest Ref Range: 5-15  5   Assessment: s/p Procedure(s): HYSTERECTOMY ABDOMINAL WITH BILATERAL SALPINGECTOMY (N/A): stable and progressing well low hemaglobin of 8.9 probably  dilutional. Healthy [place] patient s/p 4 units of PRBC  transfused and 1 unit of cryoprecipitate with stable vital signs. Will hold off additional transfusion.   Plan: 1. Ambulate 2. Advance diet as tolerated 3. Cefotetan 2 grams IV at 1300 Hours  4. Repeat CBC, electrolytes tomorrow am labs 5. Probably D/C foley this PM 6. Probably transfer to floor this pm 7. D/C PCA Pump 8. Percocet PO PRN 9. Hospitalist to round later this am will await additional recommendations. Appreciate their assistance.   LOS: 2 days    FERNANDEZ,JUAN H 02/10/2015, 7:46 AM

## 2015-02-10 NOTE — Progress Notes (Signed)
Pt trasferred to room 305 via wc. Report received.

## 2015-02-10 NOTE — Addendum Note (Signed)
Addendum  created 02/10/15 [id number] by Jonna Munro, CRNA   Modules edited: Notes Section   Notes Section:  File: [id number]

## 2015-02-10 NOTE — Progress Notes (Signed)
PCA d/c per MD order. Wasted in sink 31ml Morphine PCA out of 43ml PCA syrringe. See MAR for documentation of doses given.

## 2015-02-11 ENCOUNTER — Encounter (HOSPITAL_COMMUNITY): Payer: Self-pay | Admitting: Gynecology

## 2015-02-11 DIAGNOSIS — Z90710 Acquired absence of both cervix and uterus: Secondary | ICD-10-CM | POA: Diagnosis present

## 2015-02-11 LAB — CBC
HEMATOCRIT: 25.7 % — AB (ref 36.0–46.0)
HEMOGLOBIN: 8.8 g/dL — AB (ref 12.0–15.0)
MCH: 30.6 pg (ref 26.0–34.0)
MCHC: 34.2 g/dL (ref 30.0–36.0)
MCV: 89.2 fL (ref 78.0–100.0)
PLATELETS: 177 10*3/uL (ref 150–400)
RBC: 2.88 MIL/uL — AB (ref 3.87–5.11)
RDW: 18.7 % — ABNORMAL HIGH (ref 11.5–15.5)
WBC: 16.1 10*3/uL — AB (ref 4.0–10.5)

## 2015-02-11 MED ORDER — OXYCODONE-ACETAMINOPHEN 5-325 MG PO TABS: 1.0000 | ORAL_TABLET | ORAL | Status: DC | PRN

## 2015-02-11 MED ORDER — DOCUSATE SODIUM 100 MG PO CAPS: 100.0000 mg | ORAL_CAPSULE | Freq: Every day | ORAL | Status: DC | PRN

## 2015-02-11 MED ORDER — METOCLOPRAMIDE HCL 10 MG PO TABS: 10.0000 mg | ORAL_TABLET | Freq: Four times a day (QID) | ORAL | Status: DC | PRN

## 2015-02-11 NOTE — Progress Notes (Signed)
[employer] [employer] . Pt d/c instructions and prescriptions reviewed with patient. Pt verbalized understanding. Pt will f/u with MD on Wed 11-16 for staple removal. Pt d/c home with family, stable, via wc to private car.

## 2015-02-12 ENCOUNTER — Other Ambulatory Visit: Payer: Self-pay | Admitting: Gynecology

## 2015-02-12 LAB — TYPE AND SCREEN
ABO/RH(D): O POS
ANTIBODY SCREEN: NEGATIVE
UNIT DIVISION: 0
UNIT DIVISION: 0
UNIT DIVISION: 0
Unit division: 0
Unit division: 0
Unit division: 0
Unit division: 0

## 2015-02-12 LAB — URINE CULTURE: Culture: 9000

## 2015-02-12 MED ORDER — NITROFURANTOIN MONOHYD MACRO 100 MG PO CAPS: 100.0000 mg | ORAL_CAPSULE | Freq: Two times a day (BID) | ORAL | Status: DC

## 2015-02-12 NOTE — Discharge Summary (Signed)
Physician Discharge Summary  Patient ID: Candace Mccormick MRN: [id number] DOB/AGE: [birth year] 44 y.o.  Admit date: 02/08/2015 Discharge date: 02/12/2015  Admission Diagnoses: Vaginal bleeding status post resectoscopic polypectomy 02/03/2015. Severe anemia  Discharge Diagnoses: #1 vaginal hemorrhage #2 anemia #3 status post abdominal supracervical hysterectomy with bilateral salpingectomy    Discharged Condition: good  Hospital Course: Patient was admitted to the hospital on November 5 for the first time where she underwent a resectoscopic polypectomy as a result of a prolapsed uterine polyp contributing to her vaginal bleeding. Patient then was readmitted on November 11 with persistent vaginal bleeding and was found to have a hemoglobin of 6.9 and was admitted to the hospital at which time  an intrauterine Foley catheter was placein an effort to tamponadethe bleeding as she was transfused 3 units of packed red blood cell in preparation for surgery the following day. After transfusion of 3 units of packed red blood cell her hemoglobin rose to 9 point  and the fourth unit  was administered in the operating room. Later that evening after shehad undergone an earlier   supracervical hysterectomy with bilateral salpingectomy her hemoglobin was 9.6 and it was noted that her fibrinogen was 174, PT 17.1, INR 1.38, and PTT 37. Hematologist oncologist was consulted and recommended only giving one unit of cryoprecipitate to help with the fibrinogen. After surgery she was kept 24 hours also in the ICU as she was preop for fluid management and monitorization. Hospitalist had been consulted to assist in fluid management due to the fact that she was mildly oliguric but after fluid bolus of normal saline and ambulation patient's urine output improved and she was  always on the positive side on her I/0's. The following afternoon after her surgery her Foley catheter was removed patient had passed gas and was ambulating and  voiding well and started to a regular diet and was moved from the ICU to the floor. Patient received a second dose of cefotetan 24 hours after her surgery. Upon discharge her hemoglobin remained stable at 8.8 g. And her temperature was 98.4 and her blood pressure was 118/48 with respirations 16 and a heart rate of 75. Her dressing was removed our incision site was intact with staples still in place. Patient's entire management pre-op and postop management to include counseling for surgery as well as transfusion risks  had been discussed with  the use of translator in [place]. Prior to discharge patient had had a bowel movement she had been given Colace and a Dulcolax suppository and also to assist as she received an enema as well.  Consults: Hospitalist  Significant Diagnostic Studies: labs: Hemoglobin on admission 6.2 after 3 units of packed red blood cell was 9.6 g immediately postop hemoglobin 10.4 postop day 1 hemoglobin 8.9 postop day 2 hemoglobin 8.8 g. Prior to receiving 1 unit of cryoprecipitate her fibrinogen was 174 postop day 1 after cryoprecipitate fibrinogen was back to normal at 355 her PT was returning back to normal and was 16.2 INR was normal at 1.36 PTT was normal at 37 electrolytes were normal as well as her serum creatinine with a BUN of 5 and creatinine 0.52.  Treatments: IV hydration, antibiotics: Cefotetan 2 g prior to surgery and repeated 24 hours later., surgery: Abdominal hysterectomy with bilateral salpingectomy and 1 unit of cryoprecipitate and 4 units of packed red blood cell.  Discharge Exam: Blood pressure 118/48, pulse 75, temperature 98.4 F (36.9 C), temperature source Oral, resp. rate 16, height 4\' 7"  (1.397 m),  weight 117 lb 4.8 oz (53.207 kg), last menstrual period 01/28/2015, SpO2 98 %. General appearance: alert and cooperative Resp: clear to auscultation bilaterally GI: soft, non-tender; bowel sounds normal; no masses,  no organomegaly Extremities: extremities  normal, atraumatic, no cyanosis or edema Incision/Wound:  Disposition: 01-Home or Self Care  Discharge Instructions    Call MD for:  difficulty breathing, headache or visual disturbances    Complete by:  As directed      Call MD for:  hives    Complete by:  As directed      Call MD for:  persistant dizziness or light-headedness    Complete by:  As directed      Call MD for:  persistant nausea and vomiting    Complete by:  As directed      Call MD for:  redness, tenderness, or signs of infection (pain, swelling, redness, odor or green/yellow discharge around incision site)    Complete by:  As directed      Call MD for:  severe uncontrolled pain    Complete by:  As directed      Call MD for:  temperature >100.4    Complete by:  As directed      Diet general    Complete by:  As directed      Discharge instructions    Complete by:  As directed   1.My office will call you Monday for appointment to see me on Wednesday to remove staples 2.Begin taking iron tablet one daily starting Wednesday 3.Drink  Plenty of fluids 4. Take Colace (stool softner) once daily 5. Percocet is for PAIN ONLY one every 6 hours if needed 6. Take Ibuprofen (tylenol or Motrin other times) as needed so you no use Percocet to much and cause constipation 7. Reglan is for Nausea or vomiting IF NEEDED only one every 6 hours 8. Shower baths only 9. Walking is good 10. Use breathing spiromter ten times every 2- 3 hours while awake for next 2-3 days     Driving Restrictions    Complete by:  As directed   1 week     Increase activity slowly    Complete by:  As directed      Lifting restrictions    Complete by:  As directed   6 weeks     Sexual Activity Restrictions    Complete by:  As directed   6 weeks            Medication List    STOP taking these medications        megestrol 40 MG tablet  Commonly known as:  MEGACE      TAKE these medications        docusate sodium 100 MG capsule  Commonly  known as:  COLACE  Take 1 capsule (100 mg total) by mouth daily as needed for mild constipation.     metoCLOPramide 10 MG tablet  Commonly known as:  REGLAN  Take 1 tablet (10 mg total) by mouth every 6 (six) hours as needed for nausea.     oxyCODONE-acetaminophen 5-325 MG tablet  Commonly known as:  PERCOCET/ROXICET  Take 1 tablet by mouth every 4 (four) hours as needed (Pain).         SignedTerrance Mass 02/12/2015, 6:38 AM

## 2015-02-14 ENCOUNTER — Encounter: Payer: Self-pay | Admitting: Gynecology

## 2015-02-14 ENCOUNTER — Ambulatory Visit (INDEPENDENT_AMBULATORY_CARE_PROVIDER_SITE_OTHER): Payer: 59 | Admitting: Gynecology

## 2015-02-14 DIAGNOSIS — Z4802 Encounter for removal of sutures: Secondary | ICD-10-CM

## 2015-02-14 NOTE — Progress Notes (Signed)
   Patient presented to the office today to have her staples removed. Patient was recently discharged from the hospital on 02/12/2015. Patient status post abdominal supracervical hysterectomy with bilateral salpingectomy. Due to her preoperative blood loss she had been transfused 4 units of packed red blood cell and due to her low fibrinogen level she had received 1 unit of cryoprecipitate as well as. Through the use of a [place] interpreter she states that she is having bowel movements and urinating. Her urine culture that was obtained the day of discharge had demonstrated enterococcus for which she was called in [place] one by mouth twice a day for 7 days. Patient's hemoglobin on 2 separate days postop remained stable at 8.9 and 8.8 respectively and she's currently on iron supplementation 1 twice a day. Patient stated she had some external vulvar pruritus problem as a result of the antibiotics that she had received in the hospital prophylactically. She reports no vaginal bleeding.  Exam: Cardiac: Heart regular rate and rhythm (70 bpm) no gallops or murmurs. Abdomen: Pfannenstiel incision intact staples were removed and incision Steri-Stripped. Some ecchymotic areas were noted at the edge of the incision. Her abdomen was soft nontender no rebound or guarding and positive bowel sounds were present  Patient will return back to the office in 2 weeks for first postop visit we will check her CBC at that time. She will continue with her iron supplementation 1 twice a day for her anemia. And she will take her Macrobid one by mouth twice a day for 7 days for urinary tract infection. She had also been prescribed Colace to take 1 by mouth daily as a stool softer. Her husband states that she has been afebrile at home. Ambulation encouraged.

## 2015-02-16 ENCOUNTER — Ambulatory Visit: Payer: 59 | Admitting: Gynecology

## 2015-02-27 ENCOUNTER — Institutional Professional Consult (permissible substitution): Payer: 59 | Admitting: Gynecology

## 2015-02-28 ENCOUNTER — Ambulatory Visit (HOSPITAL_COMMUNITY): Admission: RE | Admit: 2015-02-28 | Payer: 59 | Source: Ambulatory Visit | Admitting: Gynecology

## 2015-02-28 ENCOUNTER — Ambulatory Visit (INDEPENDENT_AMBULATORY_CARE_PROVIDER_SITE_OTHER): Payer: 59 | Admitting: Gynecology

## 2015-02-28 ENCOUNTER — Encounter (HOSPITAL_COMMUNITY): Admission: RE | Payer: Self-pay | Source: Ambulatory Visit

## 2015-02-28 ENCOUNTER — Encounter: Payer: Self-pay | Admitting: Gynecology

## 2015-02-28 VITALS — BP 106/64

## 2015-02-28 DIAGNOSIS — R300 Dysuria: Secondary | ICD-10-CM

## 2015-02-28 DIAGNOSIS — D62 Acute posthemorrhagic anemia: Secondary | ICD-10-CM

## 2015-02-28 DIAGNOSIS — Z09 Encounter for follow-up examination after completed treatment for conditions other than malignant neoplasm: Secondary | ICD-10-CM

## 2015-02-28 LAB — URINALYSIS W MICROSCOPIC + REFLEX CULTURE
Bilirubin Urine: NEGATIVE
CASTS: NONE SEEN [LPF]
CRYSTALS: NONE SEEN [HPF]
GLUCOSE, UA: NEGATIVE
KETONES UR: NEGATIVE
Leukocytes, UA: NEGATIVE
Nitrite: NEGATIVE
PH: 7 (ref 5.0–8.0)
Protein, ur: NEGATIVE
SPECIFIC GRAVITY, URINE: 1.015 (ref 1.001–1.035)
YEAST: NONE SEEN [HPF]

## 2015-02-28 SURGERY — DILATATION & CURETTAGE/HYSTEROSCOPY WITH MYOSURE
Anesthesia: General

## 2015-02-28 NOTE — Progress Notes (Signed)
   Patient presented to the office for 3 weeks postop visit.Patient status post abdominal supracervical hysterectomy with bilateral salpingectomy. Due to her preoperative blood loss she had been transfused 4 units of packed red blood cell and due to her low fibrinogen level she had received 1 unit of cryoprecipitate as well as. Through the use of a [place] interpreter she states that she is having bowel movements and urinating. Her urine culture that was obtained the day of discharge had demonstrated enterococcus for which she was called in [place] one by mouth twice a day for 7 days. Patient's hemoglobin on 2 separate days postop remained stable at 8.9 and 8.8 respectively and she's currently on iron supplementation 1 twice a day. Since yesterday she described midstream that she felt some slight burning urination's or urine culture will be obtained today.  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 - BENIGN SECRETORY PHASE ENDOMETRIUM. - BENIGN CERVICAL MUCOSA; NEGATIVE FOR INTRAEPITHELIAL LESION OR MALIGNANCY. - UNREMARKABLE UTERINE SEROSA. - BENIGN RIGHT AND LEFT FALLOPIAN TUBES; NEGATIVE FOR ATYPIA OR MALIGNANCY. - BENIGN PARATUBAL; NEGATIVE FOR ATYPIA OR MALIGNANCY.  Exam: Abdomen: Soft nontender no rebound or guarding Pfannenstiel incision intact Pelvic: Bartholin urethra Skene was within normal limits Vagina: No lesions or discharge Cervix: Small amount of blood noted at the external cervical os Bimanual exam: No palpable masses or tenderness Adnexa: No palpable masses or tenderness Rectal exam: Not done  Assessment/plan: Patient status post abdominal supracervical hysterectomy with bilateral salpingectomy 02/09/2015 as a result of dysfunctional uterine bleeding status post resection of prolapsed endometrial polyp. Patient doing well on iron twice a day. A urinalysis was  obtained today which demonstrated few bacteria 3-10 RBC 0-5 WBC. Culture pending. We will check patient's CBC today. If CBCs back to normal we'll discontinue the iron tablet or decreased to 1 tablet daily. She will return back in 3 weeks for final postop visit.

## 2015-03-01 LAB — CBC WITH DIFFERENTIAL/PLATELET
BASOS ABS: 0.2 10*3/uL — AB (ref 0.0–0.1)
BASOS PCT: 2 % — AB (ref 0–1)
EOS ABS: 0.3 10*3/uL (ref 0.0–0.7)
EOS PCT: 3 % (ref 0–5)
HCT: 37.4 % (ref 36.0–46.0)
Hemoglobin: 12.1 g/dL (ref 12.0–15.0)
LYMPHS ABS: 2.2 10*3/uL (ref 0.7–4.0)
Lymphocytes Relative: 26 % (ref 12–46)
MCH: 29.9 pg (ref 26.0–34.0)
MCHC: 32.4 g/dL (ref 30.0–36.0)
MCV: 92.3 fL (ref 78.0–100.0)
MPV: 8.8 fL (ref 8.6–12.4)
Monocytes Absolute: 0.6 10*3/uL (ref 0.1–1.0)
Monocytes Relative: 7 % (ref 3–12)
NEUTROS PCT: 62 % (ref 43–77)
Neutro Abs: 5.3 10*3/uL (ref 1.7–7.7)
PLATELETS: 569 10*3/uL — AB (ref 150–400)
RBC: 4.05 MIL/uL (ref 3.87–5.11)
RDW: 16.7 % — AB (ref 11.5–15.5)
WBC: 8.6 10*3/uL (ref 4.0–10.5)

## 2015-03-02 LAB — URINE CULTURE

## 2015-03-06 ENCOUNTER — Ambulatory Visit (HOSPITAL_COMMUNITY): Admission: RE | Admit: 2015-03-06 | Payer: 59 | Source: Ambulatory Visit | Admitting: Gynecology

## 2015-03-06 ENCOUNTER — Encounter (HOSPITAL_COMMUNITY): Admission: RE | Payer: Self-pay | Source: Ambulatory Visit

## 2015-03-06 SURGERY — DILATATION & CURETTAGE/HYSTEROSCOPY WITH MYOSURE
Anesthesia: General

## 2015-03-22 ENCOUNTER — Encounter: Payer: Self-pay | Admitting: Gynecology

## 2015-03-22 ENCOUNTER — Ambulatory Visit (INDEPENDENT_AMBULATORY_CARE_PROVIDER_SITE_OTHER): Payer: 59 | Admitting: Gynecology

## 2015-03-22 VITALS — BP 104/68

## 2015-03-22 DIAGNOSIS — N3001 Acute cystitis with hematuria: Secondary | ICD-10-CM

## 2015-03-22 DIAGNOSIS — Z09 Encounter for follow-up examination after completed treatment for conditions other than malignant neoplasm: Secondary | ICD-10-CM

## 2015-03-22 LAB — URINALYSIS W MICROSCOPIC + REFLEX CULTURE
Bilirubin Urine: NEGATIVE
CASTS: NONE SEEN [LPF]
CRYSTALS: NONE SEEN [HPF]
Glucose, UA: NEGATIVE
Ketones, ur: NEGATIVE
Leukocytes, UA: NEGATIVE
Nitrite: NEGATIVE
PROTEIN: NEGATIVE
Specific Gravity, Urine: 1.015 (ref 1.001–1.035)
WBC UA: NONE SEEN WBC/HPF (ref <=5)
YEAST: NONE SEEN [HPF]
pH: 6 (ref 5.0–8.0)

## 2015-03-22 MED ORDER — CIPROFLOXACIN HCL 250 MG PO TABS: ORAL_TABLET | ORAL | Status: DC

## 2015-03-22 MED ORDER — PHENAZOPYRIDINE HCL 200 MG PO TABS: 200.0000 mg | ORAL_TABLET | Freq: Three times a day (TID) | ORAL | Status: DC | PRN

## 2015-03-22 NOTE — Progress Notes (Signed)
   Patient is a 44 year old who presented to the office for her six-week final postop visit..Patient status post abdominal supracervical hysterectomy with bilateral salpingectomy. Due to her preoperative blood loss she had been transfused 4 units of packed red blood cell and due to her low fibrinogen level she had received 1 unit of cryoprecipitate as well as. Through the use of a [place] interpreter she states that she is having bowel movements and urinating. Her urine culture that was obtained the day of discharge had demonstrated enterococcus for which she was called in [place] one by mouth twice a day for 7 days. Patient's hemoglobin on 2 separate days postop remained stable at 8.9 and 8.8 respectively and she's currently on iron supplementation 1 twice a day. At her 3 weeks postop visit her hemoglobin was back to normal at 12.1 g. Patient was complaining of some mild back discomfort and some slight burning of urination.  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 - BENIGN SECRETORY PHASE ENDOMETRIUM. - BENIGN CERVICAL MUCOSA; NEGATIVE FOR INTRAEPITHELIAL LESION OR MALIGNANCY. - UNREMARKABLE UTERINE SEROSA. - BENIGN RIGHT AND LEFT FALLOPIAN TUBES; NEGATIVE FOR ATYPIA OR MALIGNANCY. - BENIGN PARATUBAL; NEGATIVE FOR ATYPIA OR MALIGNANCY.  Exam: Abdomen: Soft nontender no rebound or guarding Pfannenstiel incision intact Pelvic: Bartholin urethra Skene was within normal limits Vagina: No lesions or discharge Cervix: Small amount of blood noted at the external cervical os Bimanual exam: No palpable masses or tenderness Adnexa: No palpable masses or tenderness Rectal exam: Not done  Patient's urinalysis today demonstrated 10-20 RBC and few bacteria culture pending. Urine culture and sensitivity from November 12 had demonstrated that the microorganism was enterococcus  and she had been placed on Macrobid which she left the hospital. It appears that it is not as sensitive as Cipro and for this reason she will be prescribed Cipro 250 mg one by mouth daily for 3 days along with Pyridium 200 mg one by mouth 3 times a day for 3 days. Patient otherwise scheduled to return back to the office in one month to repeat her urinalysis. She can stop her iron supplementation. She will resume full normal activity. We will see her back in one year or when necessary.

## 2015-03-22 NOTE — Patient Instructions (Signed)
Phenazopyridine tablets ?y l thu?c g? PHENAZOPYRIDINE l thu?c gi?m ?au. N ???c dng ?? lm ng?ng tnh tr?ng ?au, rt, ho?c kh ch?u do nhi?m trng ho?c kch ?ng ???ng ti?u gy ra. Thu?c ny khng ph?i l thu?c khng sinh. N s? khng ch?a kh?i b?nh nhi?m trng ???ng ti?u. Thu?c ny c th? ???c dng cho nh?ng m?c ?ch khc; hy h?i ng??i cung c?p d?ch v? y t? ho?c d??c s? c?a mnh, n?u qu v? c th?c m?c. Ti c?n ph?i bo cho ng??i cung c?p d?ch v? y t? c?a mnh ?i?u g tr??c khi dng thu?c ny? H? c?n bi?t li?u qu v? hi?n c b?t k? tnh tr?ng no sau ?y hay khng: -ch?ng thi?u men glucose-6-phosphate dehydrogenase (G6PD) -b?nh th?n -pha?n ??ng b?t th???ng ho??c di? ??ng v??i phenazopyridine -pha?n ??ng b?t th???ng ho??c di? ??ng v??i ca?c d??c ph?m kha?c -pha?n ??ng b?t th???ng ho??c di? ??ng v??i th??c ph?m, thu?c nhu?m, ho??c ch?t ba?o qua?n -?ang c thai ho??c ??nh co? thai -?ang cho con bu? Ti nn s? d?ng thu?c ny nh? th? no? U?ng thu?c ny v?i m?t ly n??c. Hy lm theo cc h??ng d?n trn h?p thu?c ho?c nhn thu?c. Dng sau cc b?a ?n chnh. Dng thu?c ny vo nh?ng kho?ng th?i gian ??u nhau. Khng ???c dng thu?c ny nhi?u l?n h?n ? ???c ch? d?n. Khng ???c b? qua cc li?u thu?c ho?c ng?ng thu?c ny s?m, ngay c? khi qu v? c?m th?y kh h?n. Khng ???c ng?ng s? d?ng thu?c ny, ngo?i tr? ?ang lm theo l?i khuyn c?a bc s?. Hy bn v?i bc s? nhi khoa c?a qu v? v? vi?c dng thu?c ny ? tr? em. C th? c?n ch?m [place] ??c bi?t. Qu li?u: N?u qu v? cho r?ng mnh ? dng qu nhi?u thu?c ny, th hy lin l?c v?i trung tm ki?m sot ch?t ??c ho?c phng c?p c?u ngay l?p t?c. L?U : Thu?c ny ch? dnh ring cho qu v?. Khng chia s? thu?c ny v?i nh?ng ng??i khc. N?u ti l? qun m?t li?u th sao? N?u qu v? l? qun m?t li?u thu?c, hy dng li?u thu?c ? ngay khi c th?. N?u h?u nh? ? ??n gi? dng li?u thu?c k? ti?p, th ch? dng li?u thu?c k? ti?p ?Marland Kitchen Khng ???c dng li?u g?p ?i  ho?c dng thm li?u. Nh?ng g c th? t??ng tc v?i thu?c ny? Cc t??ng tc v?i thu?c khng x?y ra. Danh sch ny c th? khng m t? ?? h?t cc t??ng tc c th? x?y ra. Hy ??a cho ng??i cung c?p d?ch v? y t? c?a mnh danh sch t?t c? cc thu?c, th?o d??c, cc thu?c khng c?n toa, ho?c cc ch? ph?m b? sung m qu v? dng. C?ng nn bo cho h? bi?t r?ng qu v? c ht thu?c, u?ng r??u, ho?c c s? d?ng ma ty tri php hay khng. Vi th? c th? t??ng tc v?i thu?c c?a qu v?. Ti c?n ph?i theo di ?i?u g trong khi dng thu?c ny? Hy bo cho bc s? ho?c chuyn vin y t?, n?u cc tri?u ch?ng c?a qu v? khng kh h?n, ho?c tr? nn n?ng h?n. Thu?c ny lm cho cc d?ch ti?t c?a c? th? c mu ??. Tc d?ng ny v h?i v s? h?t sau khi qu v? khng dng thu?c n?a. N s? lm cho n??c ti?u c mu cam s?m ho?c mu ??. Mu ?? c th? lm nhu?m b?n trang ph?c. Knh p trng m?m c th?  b? nhu?m mu v?nh vi?n. T?t nh?t l khng nn mang knh p trng m?m trong khi dng thu?c ny. N?u qu v? b? b?nh ti?u ???ng, th qu v? c th? c k?t qu? ???ng trong n??c ti?u b? d??ng tnh gi?Marland Kitchen Hy th?o lu?n v?i bc s? ho?c [place] vin y t? c?a mnh. Ti c th? nh?n th?y nh?ng tc d?ng ph? no khi dng thu?c ny? Nh?ng tc d?ng ph? qu v? c?n ph?i bo cho bc s? ho?c chuyn vin y t? cng s?m cng t?t: -cc ph?n ?ng d? ?ng, ch?ng h?n nh? da b? m?n ??, ng?a, n?i my ?ay, s?ng ? m?t, mi, ho?c l??i -da xanh ho?c tm -kh th? -s?t -?i ti?u t -b? b?m tm ho?c xu?t huy?t b?t th??ng -m?t m?i ho?c y?u ?t b?t th??ng -i m?a -vng da ho?c m?t Cc tc d?ng ph? khng c?n ph?i ch?m [place] y t? (hy bo cho bc s? ho?c chuyn vin y t?, n?u cc tc d?ng ph? ny ti?p di?n ho?c gy phi?n toi): -n??c ti?u s?m mu -?au ??u -kh ch?u ? bao t? Danh sch ny c th? khng m t? ?? h?t cc tc d?ng ph? c th? x?y ra. Xin g?i t?i bc s? c?a mnh ?? ???c c? v?n chuyn mn v? cc tc d?ng ph?Sander Nephew v? c th? t??ng trnh cc tc d?ng ph? cho FDA theo s?  [phone]. Ti nn c?t gi? thu?c c?a mnh ? ?u? ?? ngoi t?m tay tr? em. C?t gi? ? nhi?t ?? phng t? 14 ??n 30 ?? C (58 ??n 85 ?? F). Trnh ?m ??t v nh sng. V?t b? t?t c? thu?c ch?a dng sau ngy h?t h?n in trn nhn thu?c ho?c bao thu?c. L?U : ?y l b?n tm t?t. N c th? khng bao hm t?t c? thng tin c th? c. N?u qu v? th?c m?c v? thu?c ny, xin trao ??i v?i bc s?, d??c s?, ho?c ng??i cung c?p d?ch v? y t? c?a mnh.    2016, Elsevier/Gold Standard. (2009-02-16 00:00:00) Ciprofloxacin extended-release tablets ?y l thu?c g? CIPROFLOXACIN l thu?c khng sinh thu?c nhm quinolone. N ???c dng ?? ?i?u tr? m?t s? lo?i b?nh nhi?m khu?n. N khng c tc d?ng ??i v?i b?nh c?m l?nh, cm, ho?c cc b?nh nhi?m virus khc. Thu?c ny c th? ???c dng cho nh?ng m?c ?ch khc; hy h?i ng??i cung c?p d?ch v? y t? ho?c d??c s? c?a mnh, n?u qu v? c th?c m?c. Ti c?n ph?i bo cho ng??i cung c?p d?ch v? y t? c?a mnh ?i?u g tr??c khi dng thu?c ny? H? c?n bi?t li?u qu v? hi?n c b?t k? tnh tr?ng no sau ?y hay khng: -b?nh tim ho?c cc v?n ?? v? tim -b?nh th?n -b?nh gan -ch?ng r?i lo?n co gi?t -pha?n ??ng b?t th???ng ho??c di? ??ng v??i ciprofloxacin -pha?n ??ng b?t th???ng ho??c di? ??ng v??i cc khng sinh ho?c d??c ph?m khc -pha?n ??ng b?t th???ng ho??c di? ??ng v??i th??c ph?m, thu?c nhu?m, ho??c ch?t ba?o qua?n -?ang c thai ho??c ??nh co? thai -?ang cho con bu? Ti nn s? d?ng thu?c ny nh? th? no? U?ng thu?c ny v?i m?t ly n??c ??y. Hy lm theo cc h??ng d?n trn h?p thu?c ho?c nhn thu?c. Khng ???c c?t, nghi?n nt, ho?c nhai thu?c. Dng thu?c ny vo nh?ng kho?ng th?i gian ??u nhau. Khng ???c dng thu?c ny nhi?u l?n h?n ? ???c ch? d?n. Hy hon t?t ton b? ??t thu?c nh? ? ???c  ch? d?n, ngay c? khi qu v? ngh? r?ng tnh tr?ng c?a mnh ? kh h?n. Khng ???c b? qua cc li?u thu?c ho?c ng?ng thu?c ny s?m. Qu v? c th? u?ng thu?c ny cng ho?c khng cng v?i  th?c ?n. N c th? ???c u?ng cng lc v?i b?a ?n chnh c ?? ?n ch?a s?n ph?m lm t? s?a ho?c ch?a calci, nh?ng khng ???c u?ng ring thu?c v?i ch? s?n ph?m lm t? s?a, ch?ng h?n nh? s?a ho?c s?a chua ho?c n??c tri cy c b? sung thm calci. D??c s? s? ??a cho qu v? m?t B?n H??ng D?n v? D??c Ph?m (MedGuide) ??c bi?t cho m?i toa thu?c v cho m?i l?n mua thm thu?c ?. Hy b?o ??m ??c k? thng tin ny m?i l?n. Hy bn v?i bc s? nhi khoa c?a qu v? v? vi?c dng thu?c ny ? tr? em. C th? c?n ch?m [place] ??c bi?t. Qu li?u: N?u qu v? cho r?ng mnh ? dng qu nhi?u thu?c ny, th hy lin l?c v?i trung tm ki?m sot ch?t ??c ho?c phng c?p c?u ngay l?p t?c. L?U : Thu?c ny ch? dnh ring cho qu v?. Khng chia s? thu?c ny v?i nh?ng ng??i khc. N?u ti l? qun m?t li?u th sao? N?u qu v? l? qun m?t li?u thu?c, hy dng li?u thu?c ? ngay khi c th?. N?u h?u nh? ? ??n gi? dng li?u thu?c k? ti?p, th ch? dng li?u thu?c k? ti?p ?Marland Kitchen Khng ???c dng li?u g?p ?i ho?c dng thm li?u. Khng ???c u?ng qu 1 li?u thu?c trong m?t ngy. Nh?ng g c th? t??ng tc v?i thu?c ny? Khng ???c dng thu?c ny cng v?i b?t k? th? no sau ?y: -cisapride -droperidol -terfenadine -tizanidine Thu?c ny c?ng c th? t??ng tc v?i cc thu?c sau ?y: -cc thu?c khng acid -caffeine -cyclosporin -thu?c vin ho?c thu?c b?t ???c ??m v?i didanosine (ddI) -cc thu?c dng ?? tr? b?nh ti?u ???ng -m?t s? thu?c khng vim, ch?ng h?n nh? ibuprofen, naproxen -methotrexate -?a sinh t? (multivitamin) -omeprazole -phenytoin -probenecid -sucralfate -theophylline -warfarin Danh sch ny c th? khng m t? ?? h?t cc t??ng tc c th? x?y ra. Hy ??a cho ng??i cung c?p d?ch v? y t? c?a mnh danh sch t?t c? cc thu?c, th?o d??c, cc thu?c khng c?n toa, ho?c cc ch? ph?m b? sung m qu v? dng. C?ng nn bo cho h? bi?t r?ng qu v? c ht thu?c, u?ng r??u, ho?c c s? d?ng ma ty tri php hay khng. Vi th? c th? t??ng tc v?i  thu?c c?a qu v?. Ti c?n ph?i theo di ?i?u g trong khi dng thu?c ny? Hy bo cho bc s? ho?c chuyn vin y t?, n?u cc tri?u ch?ng c?a mnh khng kh h?n. Khng ???c ?i?u tr? tiu ch?y b?ng cc lo?i thu?c khng c?n toa. Hy lin l?c v?i bc s?, n?u qu v? b? tiu ch?y ko di h?n 2 ngy ho?c b? tiu ch?y x?i x? v tr?m tr?ng. Qu v? c th? b? bu?n ng? ho?c chng m?t. Khng ???c li xe, s? d?ng my mc, ho?c lm nh?ng vi?c c?n ph?i t?nh to cho t?i khi qu v? bi?t ???c thu?c ny ?nh h??ng ln qu v? nh? th? no. Khng ???c ng?i d?y ho?c ??ng d?y nhanh, ??c bi?t l khi qu v? l b?nh nhn l?n tu?i. ?i?u ny lm gi?m nguy c? b? chng m?t ho?c ng?t x?u. Thu?c ny c th? lm cho qu v? b? nh?y c?m  h?n v?i nh n?ng. Hy trnh ra n?ng. N?u qu v? khng th? trnh ra n?ng, th hy m?c trang ph?c b?o v? v bi thu?c ch?ng n?ng. Khng ???c dng cc ?n chi?u nh n?ng (sun lamps) ho?c gi??ng ho?c bu?ng dng ?? t?o ln da rm n?ng (sun tanning beds or booths). Khng ???c u?ng cc thu?c khng acid ho?c cc ch? ph?m c ch?a ch?t nhm, calci, s?t, magnesium, ho?c k?m trong vng 6 gi? ??ng h? tr??c v 2 gi? ??ng h? sau khi dng thu?c ny. Ti c th? nh?n th?y nh?ng tc d?ng ph? no khi dng thu?c ny? Nh?ng tc d?ng ph? qu v? c?n ph?i bo cho bc s? ho?c chuyn vin y t? cng s?m cng t?t: -cc ph?n ?ng d? ?ng, ch?ng h?n nh? da b? m?n ??, ng?a, n?i my ?ay, s?ng ? m?t, mi, ho?c l??i -cc v?n ?? v? h h?p -l l?n, g?p c m?ng, ho?c ?o gic -c?m th?y chong vng, ng?t x?u, b? t -nh?p tim khng ??u -?au ho?c s?ng ? kh?p, c? b?p, ho?c gn -?au khi ?i ti?u ho?c kh ?i ti?u -m?n ??, r?p da, bong ho?c trc da, bao g?m bn trong mi?ng. -co gi?t -?au, t, c?m gic nh? b? ki?n b, ho?c y?u b?t th??ng Cc tc d?ng ph? khng c?n ph?i ch?m [place] y t? (hy bo cho bc s? ho?c chuyn vin y t?, n?u cc tc d?ng ph? ny ti?p di?n ho?c gy phi?n toi): -tiu ch?y -bu?n i -kh ch?u ? bao t? -cc m?ng tr?ng ho?c lot trong  mi?ng Danh sch ny c th? khng m t? ?? h?t cc tc d?ng ph? c th? x?y ra. Xin g?i t?i bc s? c?a mnh ?? ???c c? v?n chuyn mn v? cc tc d?ng ph?Sander Nephew v? c th? t??ng trnh cc tc d?ng ph? cho FDA theo s? [phone]. Ti nn c?t gi? thu?c c?a mnh ? ?u? ?? ngoi t?m tay tr? em. C?t gi? ? nhi?t ?? phng t? 15 ??n 30 ?? C (58 ??n 85 ?? F). ?ng ch?t gi/h?p thu?c. V?t b? t?t c? thu?c ch?a dng sau ngy h?t h?n in trn nhn thu?c ho?c bao thu?c. L?U : ?y l b?n tm t?t. N c th? khng bao hm t?t c? thng tin c th? c. N?u qu v? th?c m?c v? thu?c ny, xin trao ??i v?i bc s?, d??c s?, ho?c ng??i cung c?p d?ch v? y t? c?a mnh.    2016, Elsevier/Gold Standard. (2011-02-27 00:00:00) Nhi?m Trng ???ng Ti?t Ni?u (Urinary Tract Infection) Nhi?m trng ???ng ti?t ni?u (UTI) c th? pht tri?n ?? b?t c? vi? tri? na?o d?c ???ng ti?t ni?u. ???ng ti?t ni?u l h? th?ng thot n??c c?a c? th? ?? lo?i b? ch?t th?i v n??c d? th?a. ???ng ti?t ni?u bao g?m hai th?n, ni?u qu?n, bng quang v ni?u ??o. Th?n l m?t c?p c? quan hnh h?t ??u. M?i th?n co? kch th??c kho?ng b?ng bn tay c?a b?n. Chng n?m d??i x??ng s??n, m?i bn c?t s?ng c m?t qu?Lourdes Sledge NHN Nhi?m trng gy b?i vi sinh v?t, l cc sinh v?t nh?, bao g?m n?m, vi rt v vi khu?n. Nh?ng sinh v?t ny nh? t?i m?c chng ch? c th? ???c nhn th?y qua knh hi?n vi. Vi khu?n l nh?ng vi sinh v?t ph? bi?n nh?t gy ra UTI. TRI?U CH?NG Cc tri?u ch?ng c?a UTI c th? khc nhau, ty theo ?? tu?i v gi?i tnh c?a b?nh nhn v b?i v? tr  nhi?m trng. Cc tri?u ch?ng ? ph? n? tr? th??ng bao g?m nhu c?u ti?u ti?n th??ng xuyn v d? d?i, c?m gic ?au v rt ? bng quang ho?c ni?u ??o khi ?i ti?u. Ph? n? v nam gi?i l?n tu?i c nhi?u kh? n?ng b? m?t m?i, run r?y v y?u, ??ng th?i b? ?au c? v ?au b?ng. S?t c th? c ngh?a l nhi?m trng ? th?n. Cc tri?u ch?ng khc c?a nhi?m trng th?n bao g?m ?au ? l?ng ho?c hai bn d??i x??ng s??n, bu?n nn v nn  m?a. CH?N ?ON ?? ch?n ?on UTI, chuyn gia ch?m [place] s?c kh?e s? h?i b?n v? nh?ng tri?u ch?ng c?a b?n. Chuyn gia ch?m [place] s?c kh?e c?ng s? yu c?u cung c?p m?u n??c ti?u. M?u n??c ti?u s? ???c xt nghi?m xem c vi khu?n v cc t? bo mu tr?ng khng. Cc t? bo mu tr?ng ???c t?o b?i c? th? ?? gip ch?ng l?i nhi?m trng. ?I?U TR? Thng th??ng, UTI c th? ???c ?i?u tr? b?ng thu?c. B?i v h?u h?t nguyn nhn gy ra UTI l do nhi?m trng b?i vi khu?n, chng th??ng c th? ???c ?i?u tr? b?ng cch s? d?ng thu?c khng sinh. Vi?c l?a ch?n khng sinh v th?i gian ?i?u tr? ph? thu?c vo tri?u ch?ng v lo?i vi khu?n gy nhi?m trng. H??NG D?N CH?M [place] T?I NH  N?u b?n ? ???c k khng sinh, hy s? d?ng chng ?ng nh? chuyn gia ch?m [place] s?c kh?e h??ng d?n. S? d?ng h?t thu?c ngay c? khi b?n c?m th?y kh h?n sau khi ch? dng m?t ph?n thu?c.  U?ng ?? n??c v dung d?ch ?? n??c ti?u trong ho?c c mu vng nh?t.  Trnh caffeine, tr v ?? u?ng c ga. Chng c xu h??ng kch thch bng quang.  ?i ti?u th??ng xuyn. Trnh nh?n ti?u trong th?i gian di.  ?i ti?u tr??c v sau khi quan h? tnh d?c.  Sau khi ?i ??i ti?n, ph? n? c?n lm s?ch t? tr??c ra sau. Ch? s? d?ng gi?y v? sinh m?t l?n. HY ?I KHM N?U:  B?n b? ?au l?ng.  B?n b? s?t.  Cc tri?u ch?ng c?a b?n khng b?t ??u ?? trong vng 3 ngy. HY NGAY L?P T?C ?I KHM N?U:  B?n b? ?au l?ng ho?c ?au b?ng d??i nghim tr?ng.  B?n b? ?n l?nh.  B?n b? bu?n nn ho?c nn m?a.  B?n lin t?c b? rt ho?c kh ch?u khi ?i ti?u. ??M B?O B?N:  Hi?u cc h??ng d?n ny.  S? theo di tnh tr?ng c?a mnh.  S? yu c?u tr? gip ngay l?p t?c n?u b?n c?m th?y khng ?? ho?c tnh tr?ng tr?m tr?ng h?n.   Thng tin ny khng nh?m m?c ?ch thay th? cho l?i khuyn m chuyn gia ch?m [place] s?c kh?e ni v?i qu v?. Hy b?o ??m qu v? ph?i th?o lu?n b?t k? v?n ?? g m qu v? c v?i chuyn gia ch?m [place] s?c kh?e c?a qu v?.   Document Released: 03/17/2005 Document Revised:  11/17/2012 Elsevier Interactive Patient Education [employer].

## 2015-03-24 LAB — URINE CULTURE
COLONY COUNT: NO GROWTH
ORGANISM ID, BACTERIA: NO GROWTH

## 2015-03-27 ENCOUNTER — Other Ambulatory Visit: Payer: Self-pay | Admitting: Gynecology

## 2015-03-27 DIAGNOSIS — N3001 Acute cystitis with hematuria: Secondary | ICD-10-CM

## 2015-03-29 ENCOUNTER — Other Ambulatory Visit: Payer: 59

## 2015-03-29 DIAGNOSIS — N3001 Acute cystitis with hematuria: Secondary | ICD-10-CM

## 2015-03-30 LAB — URINALYSIS W MICROSCOPIC + REFLEX CULTURE
BILIRUBIN URINE: NEGATIVE
Bacteria, UA: NONE SEEN [HPF]
Casts: NONE SEEN [LPF]
Crystals: NONE SEEN [HPF]
Glucose, UA: NEGATIVE
KETONES UR: NEGATIVE
Leukocytes, UA: NEGATIVE
NITRITE: NEGATIVE
PH: 6 (ref 5.0–8.0)
Protein, ur: NEGATIVE
SPECIFIC GRAVITY, URINE: 1.01 (ref 1.001–1.035)
WBC UA: NONE SEEN WBC/HPF (ref <=5)
Yeast: NONE SEEN [HPF]

## 2015-03-31 LAB — URINE CULTURE
Colony Count: NO GROWTH
Organism ID, Bacteria: NO GROWTH

## 2015-04-04 ENCOUNTER — Telehealth: Payer: Self-pay | Admitting: *Deleted

## 2015-04-04 NOTE — Telephone Encounter (Signed)
-----   Message from Ramond Craver, [place] sent at 04/03/2015  3:11 PM EST ----- Regarding: referral to urologist Per Dr.JF-"Please make an appointmentment with Dr. Nicki Reaper McDiarimid or Dr. Katrine Coho for this patient with persistant microscopic hematuria. They speak no English so you will need to go through [place] interpreter. Let me know who the urologist will be because I would like to speak with him before her appointment." Thanks JF

## 2015-04-04 NOTE — Telephone Encounter (Signed)
Appointment on 05/09/15 @ 10:00am with Dr.Tannenbaum Candace Mccormick is going to call the interpreter line to relay to patient, notes faxed

## 2015-04-10 ENCOUNTER — Encounter: Payer: Self-pay | Admitting: Women's Health

## 2015-04-10 ENCOUNTER — Ambulatory Visit (INDEPENDENT_AMBULATORY_CARE_PROVIDER_SITE_OTHER): Payer: Medicaid Other | Admitting: Women's Health

## 2015-04-10 VITALS — BP 115/80 | Ht <= 58 in | Wt 113.0 lb

## 2015-04-10 DIAGNOSIS — R300 Dysuria: Secondary | ICD-10-CM

## 2015-04-10 DIAGNOSIS — B373 Candidiasis of vulva and vagina: Secondary | ICD-10-CM | POA: Diagnosis not present

## 2015-04-10 DIAGNOSIS — B3731 Acute candidiasis of vulva and vagina: Secondary | ICD-10-CM

## 2015-04-10 LAB — URINALYSIS W MICROSCOPIC + REFLEX CULTURE
Bilirubin Urine: NEGATIVE
Crystals: NONE SEEN [HPF]
GLUCOSE, UA: NEGATIVE
Ketones, ur: NEGATIVE
LEUKOCYTES UA: NEGATIVE
NITRITE: NEGATIVE
PH: 7 (ref 5.0–8.0)
PROTEIN: NEGATIVE
Specific Gravity, Urine: <1.005 (ref 1.001–1.035)
Yeast: NONE SEEN [HPF]

## 2015-04-10 LAB — WET PREP FOR TRICH, YEAST, CLUE
CLUE CELLS WET PREP: NONE SEEN
TRICH WET PREP: NONE SEEN

## 2015-04-10 MED ORDER — FLUCONAZOLE 150 MG PO TABS: 150.0000 mg | ORAL_TABLET | Freq: Once | ORAL | Status: DC

## 2015-04-10 NOTE — Progress Notes (Signed)
Patient ID: Candace Mccormick, female   DOB: 07-26-1970, 45 y.o.   MRN: [id number] [place] interpreter via phone. Presents with complaint of vaginal burning, minimal discharge, pain in lower back, burning with urination, no pain at end of stream of urination. Denies fever. Also has some itching on lower abdomen. 01/2015 TAH supracervical for fibroids and benign cyst bilateral salpingectomy history of RSO. Same partner.  Exam: Appears well. External genitalia erythematous, speculum exam moderate amount of a white curdy discharge noted, wet prep positive for moderate yeast, bimanual no CMT. Lower abdomen several scratched areas erythematous. UA: +1 blood, 0-5 WBCs, 6-10 RBCs, few bacteria.  Yeast vaginitis  Plan: Urine culture pending. Diflucan 150 by mouth 1 dose. Yeast prevention discussed. Apply Neosporin to superficial rash on abdomen.

## 2015-04-10 NOTE — Patient Instructions (Signed)
Viêm Âm ??o Do N?m Candida °(Monilial Vaginitis) °Viêm âm ??o là m?t tình tr?ng s?ng, ?? và ?au (viêm) c?a âm ??o và âm h?. B?nh viêm âm ??o do n?m candida không ph?i là b?nh nhi?m trùng lây qua ???ng tình d?c.  °NGUYÊN NHÂN °Viêm âm ??o do n?m gây ra b?i n?m men (n?m candida) th??ng th?y trong âm ??o. Trong nhi?m trùng do n?m men, n?m candida phát tri?n quá m?c v? s? l??ng ??n ?? làm xáo tr?n cân b?ng v? m?t hóa h?c. °TRI?U CH?NG °· Huy?t tr?ng âm ??o có m?ng d?y và tr?ng. °· S?ng, ng?a, ?? t?y và kích ?ng âm ??o và có th? c? các môi c?a âm ??o (âm h?). °· Ti?u ?au hay rát bu?t. °· Giao h?p ?au. °CH?N ?OÁN °Nh?ng th? có th? góp ph?n gây ra viêm âm ??o do n?m candida là: °· Các giai ?o?n h?u mãn kinh và tr??c khi l?p gia ?ình. °· Thai k?. °· Nhi?m trùng. °· M?t m?i, u? o?i hay c?ng th?ng, ??c bi?t là n?u ?ã t?ng b? viêm âm ??o do n?m candida tr??c ?ây. °· Ti?u ???ng. (?i?u tr? ?n ??nh s? giúp h? th?p xác su?t m?c b?nh h?n.) °· Dùng các viên thu?c ng?a thai. °· M?c qu?n áo bó sát. °· S? d?ng s?a t?m nhi?u b?t, thu?c x?t v? sinh ph? n?, th?t r?a âm ??o, ho?c b?ng v? sinh d?ng nhét vào âm ??o có t?m ch?t kh? mùi. °· ?ang dùng m?t s? kháng sinh (thu?c di?t vi trùng). °· Th?nh tho?ng có th? b? tái phát n?u b? ?m. °?I?U TR? °[place]uyên gia ch?m sóc s?c kh?e s? c?p thu?c cho b?nh nhân. °· Có m?t vài lo?i kem bôi âm ??o kháng n?m candida và các thu?c ??n ??t âm ??o ??c hi?u dùng ?? ?i?u tr? viêm âm ??o do n?m candida. °· C?ng có th? dùng kem kháng n?m candida hay kem ch?a steroid ?? ?i?u tr? ng?a hay kích ?ng âm h?. Nên có s? cho phép c?a chuyên gia ch?m sóc s?c kh?e. °· Bôi lên âm ??o dung d?ch xanh methylene có th? giúp ích n?u kem kháng n?m candida không có tác d?ng. °· ?n s?a chua có th? giúp phòng ng?a viêm âm ??o do n?m candida. °H??NG D?N CH?M SÓC T?I NHÀ °· U?ng t?t c? các thu?c ?ã ???c kê ??n. °· Không quan h? tình d?c cho ??n khi hoàn t?t vi?c ?i?u tr? hay khi ???c chuyên gia ch?m sóc s?c kh?e h??ng d?n. °· Ngâm r?a  vùng kín trong n??c ?m. °· Không ???c th?t r?a âm ??o. °· Không dùng b?ng v? sinh d?ng nhét vào âm ??o, ??c bi?t là nh?ng lo?i ??p n??c hoa. °· M?c các lo?i qu?n lót v?i cotton. °· Tránh m?c qu?n ch?n và t?t li?n qu?n bó ch?t. °· Hãy nói v?i b?n tình v? vi?c b? nhi?m n?m. Nên ?i khám n?u có nh?ng tri?u ch?ng nh? phát ban d?ng nh? ho?c ng?a. °· B?n tình c?ng nên ???c ?i?u tr? luôn n?u tình tr?ng nhi?m trùng khó ch?a d?t. °· Th?c hành tình d?c an toàn h?n - s? d?ng bao cao su. °· M?t s? thu?c dùng t?i âm ??o có th? làm cho ch?t nh?a m? c?a bao cao su b? h?ng. Các thu?c dùng t?i âm ??o gây h?i cho bao cao su là: °¨ Kem thoa Cleocin. °¨ Butoconazole (Femstat®). °¨ Viên ??t âm ??o Terconazole (Terazol®). °¨ Miconazole (Monistat®) (có th? mua mà không c?n kê   toa). °H?Y ?I KHÁM N?U: °· Nhi?t ?? ?o ? mi?ng trên 38,9° C (102° F). °· Tình tr?ng nhi?m trùng c?a b?n tr? nên t? h?n sau 2 ngày ?i?u tr?. °· Tình tr?ng nhi?m trùng c?a b?n không khá h?n sau 3 ngày ?i?u tr?. °· B?n b? m?n n??c trong ho?c xung quanh âm ??o. °· B?n b? ch?y máu âm ??o, và nó không ph?i là k? kinh nguy?t c?a b?n. °· B?n b? ?au khi ?i ti?u. °· B?n b? các v?n ?? v? ???ng ru?t. °· B?n b? ?au khi giao h?p. °  °Thông tin này không nh?m m?c ?ích thay th? cho l?i khuyên mà chuyên gia ch?m sóc s?c kh?e nói v?i quý v?. Hãy b?o ??m quý v? ph?i th?o lu?n b?t k? v?n ?? gì mà quý v? có v?i chuyên gia ch?m sóc s?c kh?e c?a quý v?. °  °Document Released: 03/17/2005 Document Revised: 11/17/2012 °Elsevier Interactive Patient Education ©2016 Else[employer]. ° °

## 2015-04-11 LAB — URINE CULTURE
Colony Count: NO GROWTH
ORGANISM ID, BACTERIA: NO GROWTH

## 2015-09-10 ENCOUNTER — Encounter: Payer: Self-pay | Admitting: Family Medicine

## 2015-09-10 ENCOUNTER — Ambulatory Visit (INDEPENDENT_AMBULATORY_CARE_PROVIDER_SITE_OTHER): Payer: Medicaid Other | Admitting: Family Medicine

## 2015-09-10 VITALS — BP 98/50 | HR 54 | Temp 98.2°F | Resp 14 | Ht 59.0 in | Wt 119.0 lb

## 2015-09-10 DIAGNOSIS — Z1239 Encounter for other screening for malignant neoplasm of breast: Secondary | ICD-10-CM | POA: Diagnosis not present

## 2015-09-10 DIAGNOSIS — R51 Headache: Secondary | ICD-10-CM | POA: Diagnosis not present

## 2015-09-10 DIAGNOSIS — R42 Dizziness and giddiness: Secondary | ICD-10-CM | POA: Diagnosis not present

## 2015-09-10 DIAGNOSIS — R631 Polydipsia: Secondary | ICD-10-CM

## 2015-09-10 DIAGNOSIS — R519 Headache, unspecified: Secondary | ICD-10-CM

## 2015-09-10 DIAGNOSIS — H538 Other visual disturbances: Secondary | ICD-10-CM

## 2015-09-10 DIAGNOSIS — J309 Allergic rhinitis, unspecified: Secondary | ICD-10-CM

## 2015-09-10 DIAGNOSIS — R635 Abnormal weight gain: Secondary | ICD-10-CM

## 2015-09-10 LAB — CBC WITH DIFFERENTIAL/PLATELET
Basophils Absolute: 164 cells/uL (ref 0–200)
Basophils Relative: 2 %
EOS PCT: 4 %
Eosinophils Absolute: 328 cells/uL (ref 15–500)
HCT: 40.8 % (ref 35.0–45.0)
Hemoglobin: 13.6 g/dL (ref 11.7–15.5)
LYMPHS PCT: 30 %
Lymphs Abs: 2460 cells/uL (ref 850–3900)
MCH: 31.9 pg (ref 27.0–33.0)
MCHC: 33.3 g/dL (ref 32.0–36.0)
MCV: 95.8 fL (ref 80.0–100.0)
MPV: 9.2 fL (ref 7.5–12.5)
Monocytes Absolute: 656 cells/uL (ref 200–950)
Monocytes Relative: 8 %
NEUTROS PCT: 56 %
Neutro Abs: 4592 cells/uL (ref 1500–7800)
PLATELETS: 283 10*3/uL (ref 140–400)
RBC: 4.26 MIL/uL (ref 3.80–5.10)
RDW: 12.7 % (ref 11.0–15.0)
WBC: 8.2 10*3/uL (ref 3.8–10.8)

## 2015-09-10 LAB — POCT URINALYSIS DIP (DEVICE)
BILIRUBIN URINE: NEGATIVE
Glucose, UA: NEGATIVE mg/dL
Ketones, ur: NEGATIVE mg/dL
LEUKOCYTES UA: NEGATIVE
Nitrite: NEGATIVE
Protein, ur: NEGATIVE mg/dL
SPECIFIC GRAVITY, URINE: 1.015 (ref 1.005–1.030)
Urobilinogen, UA: 0.2 mg/dL (ref 0.0–1.0)
pH: 7 (ref 5.0–8.0)

## 2015-09-10 LAB — TSH: TSH: 0.85 m[IU]/L

## 2015-09-10 MED ORDER — CETIRIZINE HCL 10 MG PO TABS: 10.0000 mg | ORAL_TABLET | Freq: Every day | ORAL | Status: AC

## 2015-09-10 MED ORDER — IBUPROFEN 200 MG PO TABS: 200.0000 mg | ORAL_TABLET | Freq: Four times a day (QID) | ORAL | Status: AC | PRN

## 2015-09-10 NOTE — Progress Notes (Signed)
Subjective:    Patient ID: Candace Mccormick, female    DOB: 06/14/1970, 45 y.o.   MRN: [id number]  HPI Ms. Orren, a 45 year old female presents to establish care. She states that she has not had a primary provider since moving to area. Patient primarily speaks [place], will utilize video interpreter assist with communication. Patient moved from [place] 9 years ago. She is currently complaining of headache pain and nasal pressure. Patient says that she has had a headache for the past month. She describes headache as intermittent and aching. Current pain intensity is 2/10. She denies fever, fatigue, shortness of breath, chest pains, nausea, vomiting, or diarrhea. She last had Ibuprofen on last night with maximum relief.   Patient is also complaining of periodic dizziness. The dizziness has been present for a month. She describes dizziness as intermittent. She has not described any palliative or provocative factors. Patient denies aural pressure, otalgia, otorrhea, tinnitus, or hearing loss.  She has not attempted any OTC interventions to alleviate symptoms.    Past Medical History  Diagnosis Date  . Cervical polyp   . Anemia    Past Surgical History  Procedure Laterality Date  . Cervical polypectomy    . Cesarean section      x 2 with BTL  . Dilatation & curettage/hysteroscopy with myosure N/A 02/03/2015    Procedure: DILATATION & CURETTAGE/HYSTEROSCOPY WITH MYOSURE;  Surgeon: Terrance Mass, MD;  Location: [facility];  Service: Gynecology;  Laterality: N/A;  . Abdominal hysterectomy N/A 02/09/2015    Procedure: HYSTERECTOMY ABDOMINAL WITH BILATERAL SALPINGECTOMY;  Surgeon: Terrance Mass, MD;  Location: [facility];  Service: Gynecology;  Laterality: N/A;   Immunization History  Administered Date(s) Administered  . Influenza,inj,Quad PF,36+ Mos 01/23/2014   Review of Systems  Constitutional: Positive for unexpected weight change (Weight gain). Negative for fever and fatigue.  HENT:  Positive for postnasal drip and sinus pressure. Negative for congestion, dental problem, ear pain, hearing loss, sore throat, trouble swallowing and voice change.   Eyes: Negative.  Negative for photophobia, redness and visual disturbance.  Respiratory: Negative.   Cardiovascular: Negative.   Gastrointestinal: Negative.   Endocrine: Negative.  Negative for polydipsia, polyphagia and polyuria.  Genitourinary: Negative.   Musculoskeletal: Negative.   Skin: Negative.   Allergic/Immunologic: Negative.   Neurological: Positive for dizziness (occasionally) and headaches.  Hematological: Negative.   Psychiatric/Behavioral: Negative.        Objective:   Physical Exam  Constitutional: She is oriented to person, place, and time. She appears well-developed and well-nourished.  HENT:  Head: Normocephalic.  Right Ear: External ear normal.  Nose: Mucosal edema present. Right sinus exhibits frontal sinus tenderness.  Mouth/Throat: Oropharynx is clear and moist.  TM cloudy bilaterally  Eyes: EOM are normal. Pupils are equal, round, and reactive to light.  Neck: Normal range of motion. Neck supple.  Cardiovascular: Normal rate, regular rhythm, normal heart sounds and intact distal pulses.   Pulmonary/Chest: Effort normal and breath sounds normal. She has no wheezes. She has no rales. She exhibits no tenderness.  Abdominal: Soft. Bowel sounds are normal.  Musculoskeletal: Normal range of motion.  Lymphadenopathy:       Head (right side): No submental, no submandibular and no tonsillar adenopathy present.       Head (left side): No submental, no submandibular and no tonsillar adenopathy present.  Neurological: She is alert and oriented to person, place, and time. She has normal strength and normal reflexes. No cranial nerve deficit or  sensory deficit. She displays a negative Romberg sign.  Skin: Skin is warm and dry.  Psychiatric: She has a normal mood and affect. Her behavior is normal. Judgment  and thought content normal. Her mood appears not anxious. She does not exhibit a depressed mood.      BP 98/50 mmHg  Pulse 54  Temp(Src) 98.2 F (36.8 C) (Oral)  Resp 14  Ht 4\' 11"  (1.499 m)  Wt 119 lb (53.978 kg)  BMI 24.02 kg/m2  SpO2 100%  LMP  (LMP Unknown) Assessment & Plan:   1. Headache, unspecified headache type - ibuprofen (ADVIL,MOTRIN) 200 MG tablet; Take 1 tablet (200 mg total) by mouth every 6 (six) hours as needed.  Dispense: 30 tablet; Refill: 0  2. Dizziness Will review labs and follow up by phone with laboratory results. Symptoms are not reproducible on physical examination.   3. Blurred vision  - Hemoglobin A1c  4. Breast cancer screening  - MM Digital Screening; Future  5. Weight gain Recommend a lowfat, low carbohydrate diet divided over 5-6 small meals, increase water intake to 6-8 glasses, and 150 minutes per week of cardiovascular exercise.   - Hemoglobin A1c - CBC with Differential - COMPLETE METABOLIC PANEL WITH GFR - TSH - POCT urinalysis dip (device)  6. Allergic rhinitis, unspecified allergic rhinitis type  - cetirizine (ZYRTEC) 10 MG tablet; Take 1 tablet (10 mg total) by mouth daily.  Dispense: 30 tablet; Refill: 11  7. Polydipsia  - Hemoglobin A1c  RTC: Will follow up by phone with laboratory results     Routine Health Maintenance:  Hysterectomy-complete: Pap smear 2 years ago Recommend routine screening mammogram     Dorena Dew, FNP

## 2015-09-11 LAB — COMPLETE METABOLIC PANEL WITH GFR
ALT: 14 U/L (ref 6–29)
AST: 14 U/L (ref 10–35)
Albumin: 4 g/dL (ref 3.6–5.1)
Alkaline Phosphatase: 53 U/L (ref 33–115)
BUN: 13 mg/dL (ref 7–25)
CHLORIDE: 106 mmol/L (ref 98–110)
CO2: 24 mmol/L (ref 20–31)
Calcium: 9.1 mg/dL (ref 8.6–10.2)
Creat: 0.55 mg/dL (ref 0.50–1.10)
GFR, Est African American: >89 mL/min (ref >=60)
GFR, Est Non African American: >89 mL/min (ref >=60)
GLUCOSE: 67 mg/dL (ref 65–99)
POTASSIUM: 4.2 mmol/L (ref 3.5–5.3)
SODIUM: 141 mmol/L (ref 135–146)
Total Bilirubin: 0.3 mg/dL (ref 0.2–1.2)
Total Protein: 6.7 g/dL (ref 6.1–8.1)

## 2015-09-11 LAB — HEMOGLOBIN A1C
Hgb A1c MFr Bld: 5.6 % (ref <5.7)
MEAN PLASMA GLUCOSE: 114 mg/dL

## 2015-09-12 ENCOUNTER — Telehealth: Payer: Self-pay

## 2015-09-12 NOTE — Telephone Encounter (Signed)
Call placed to patient using [employer] ID [phone]. Patient was advised of normal labs and to keep next scheduled appointment. Thanks!

## 2015-09-12 NOTE — Telephone Encounter (Signed)
-----   Message from Dorena Dew, [place] sent at 09/11/2015 12:24 PM EDT ----- Regarding: lab results Please inform patient that all labs are within normal limits. Please follow up in office as previously scheduled.  Thanks  ----- Message -----    From: [facility]    Sent: 09/11/2015  12:28 AM      To: Dorena Dew, FNP

## 2015-09-27 ENCOUNTER — Ambulatory Visit
Admission: RE | Admit: 2015-09-27 | Discharge: 2015-09-27 | Disposition: A | Discharge status: Home / Self Care | Payer: Medicaid Other | Source: Ambulatory Visit | Attending: Family Medicine | Admitting: Family Medicine

## 2015-09-27 DIAGNOSIS — Z1239 Encounter for other screening for malignant neoplasm of breast: Secondary | ICD-10-CM

## 2016-07-09 ENCOUNTER — Ambulatory Visit: Payer: Medicaid Other | Admitting: Family Medicine

## 2016-08-13 ENCOUNTER — Encounter: Payer: Self-pay | Admitting: Gynecology

## 2016-09-18 ENCOUNTER — Other Ambulatory Visit: Payer: Self-pay | Admitting: Family Medicine

## 2016-09-18 DIAGNOSIS — J309 Allergic rhinitis, unspecified: Secondary | ICD-10-CM

## 2016-09-29 ENCOUNTER — Other Ambulatory Visit: Payer: Self-pay | Admitting: Family Medicine

## 2016-09-29 DIAGNOSIS — J309 Allergic rhinitis, unspecified: Secondary | ICD-10-CM

## 2016-12-09 ENCOUNTER — Ambulatory Visit: Payer: Self-pay | Admitting: Obstetrics & Gynecology

## 2017-01-02 IMAGING — CT CT ABD-PELV W/ CM
2 of 5 series · 16 of 46 positions shown, 18 images · IV contrast (Omni 300)
Comparison: Pelvic ultrasound 01/17/2015.

CLINICAL DATA: 43-year-old female with history of menorrhagia,
status post cervical polypectomy in December 2013. Increased
bleeding.

EXAM:
CT ABDOMEN AND PELVIS WITH CONTRAST
TECHNIQUE: Multidetector CT imaging of the abdomen and pelvis was performed
using the standard protocol following bolus administration of
intravenous contrast.
CONTRAST:  100mL OMNIPAQUE IOHEXOL 300 MG/ML  SOLN

[Series 2: a/p w/ 5mm · axial · 0.67mm/px · z∈[+774,+1144]mm · 13 of 84 slices shown, 15 images]
[im 5/84  soft-tissue]
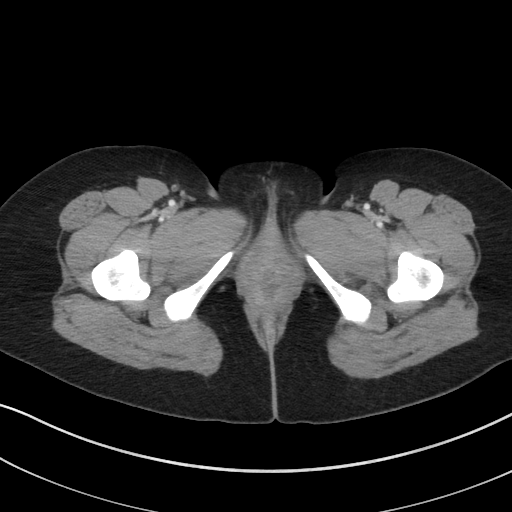
[im 5/84  bone]
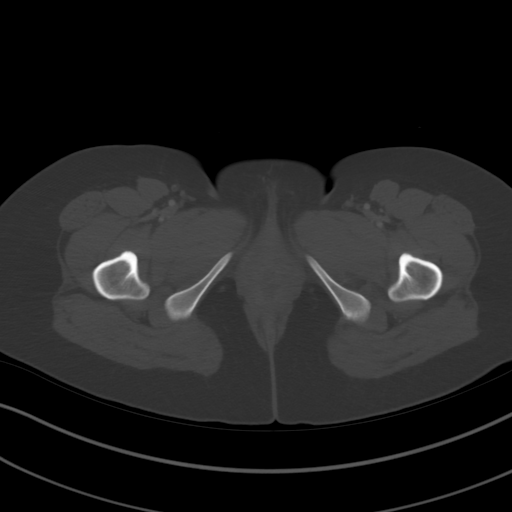
[im 10/84  soft-tissue]
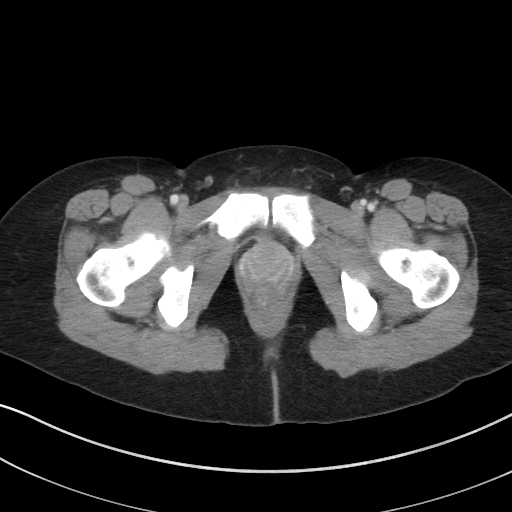
[im 20/84  soft-tissue]
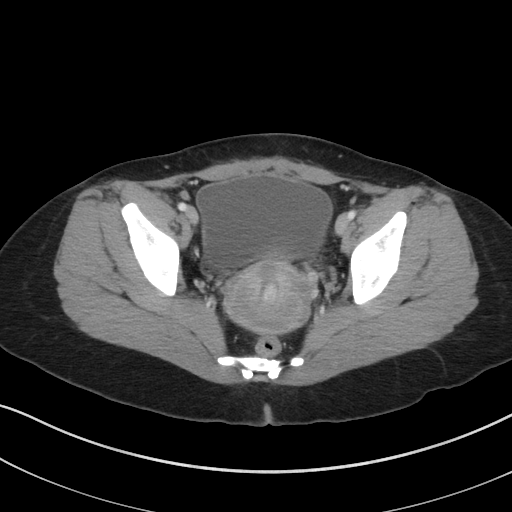
[im 25/84  soft-tissue]
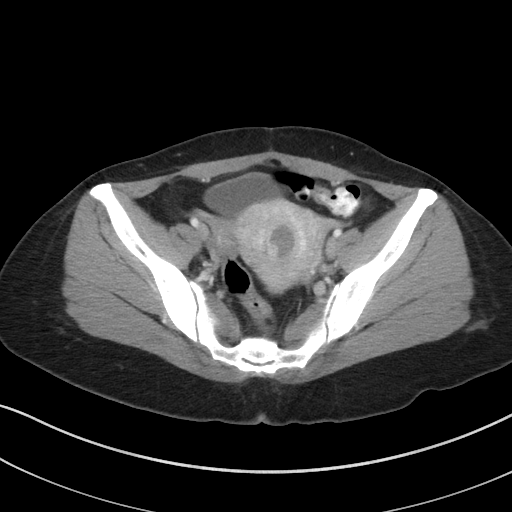
[im 30/84  soft-tissue]
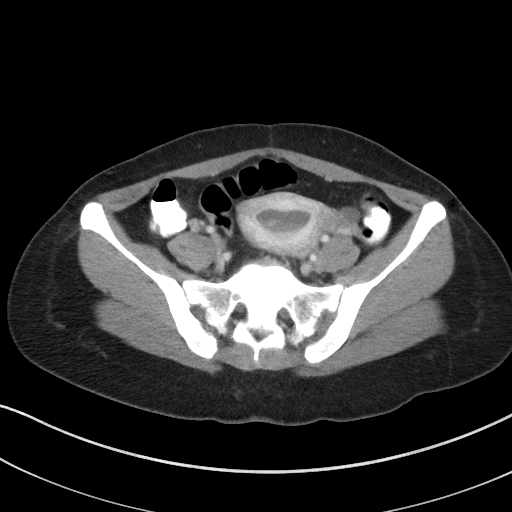
[im 35/84  soft-tissue]
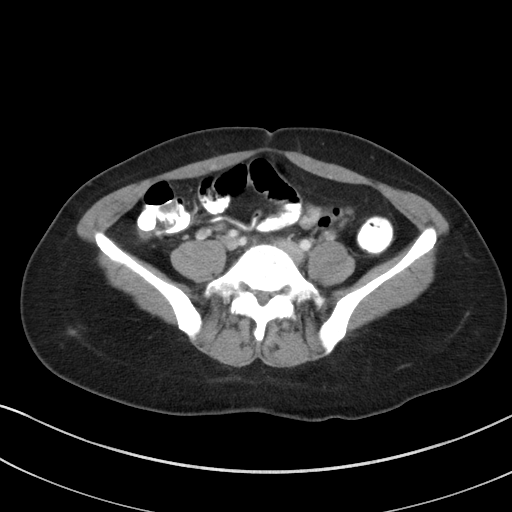
[im 44/84  soft-tissue]
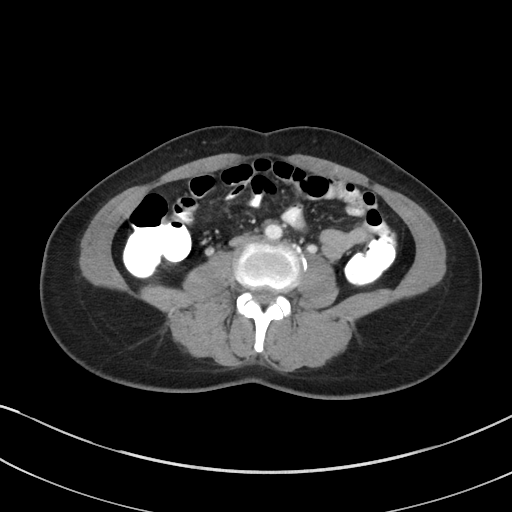
[im 49/84  soft-tissue]
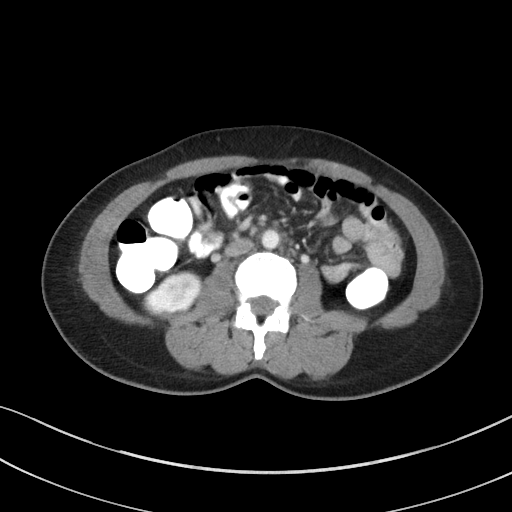
[im 54/84  soft-tissue]
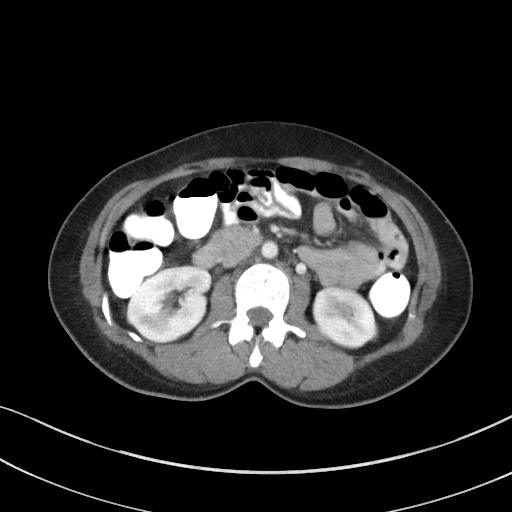
[im 54/84  bone]
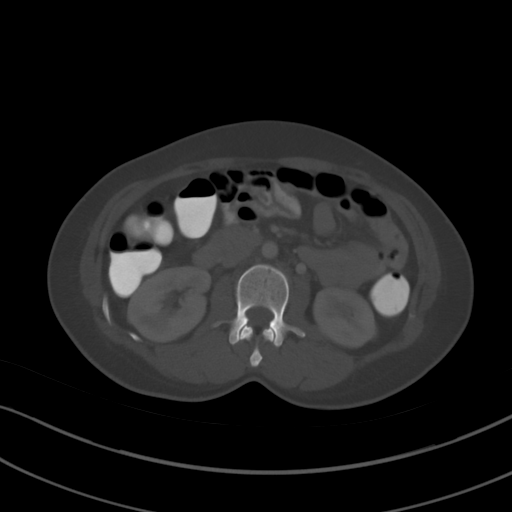
[im 59/84  soft-tissue]
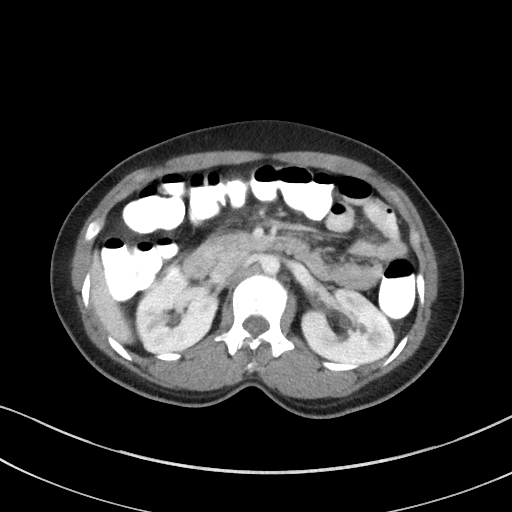
[im 64/84  soft-tissue]
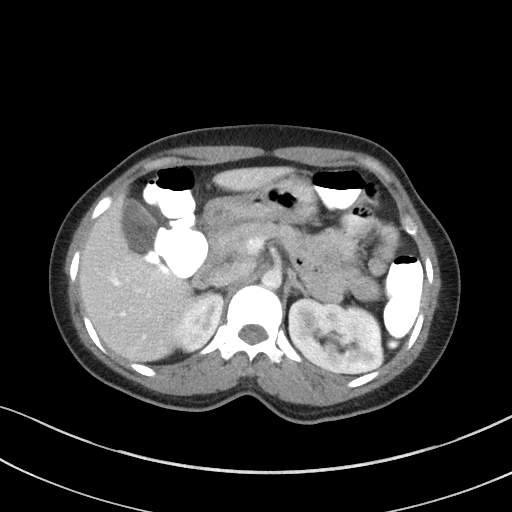
[im 74/84  soft-tissue]
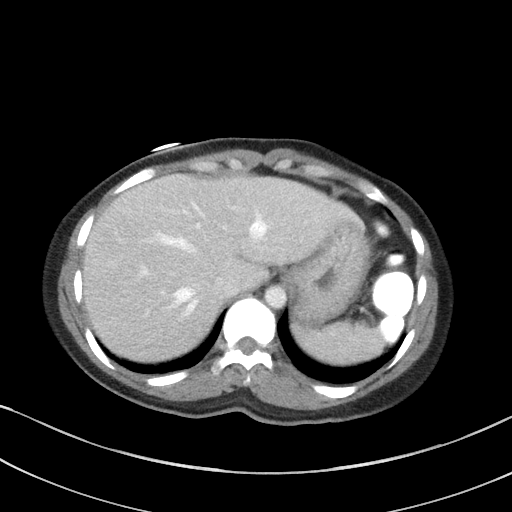
[im 79/84  soft-tissue]
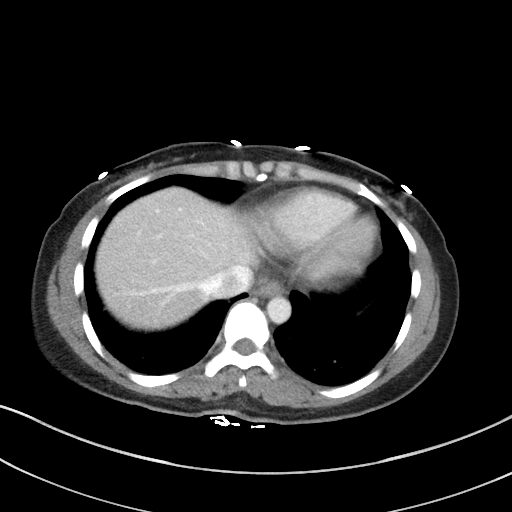

[Series 5: a/p w/ cor · coronal · 0.79mm/px · 3 of 97 slices shown]
[im 33/97  soft-tissue]
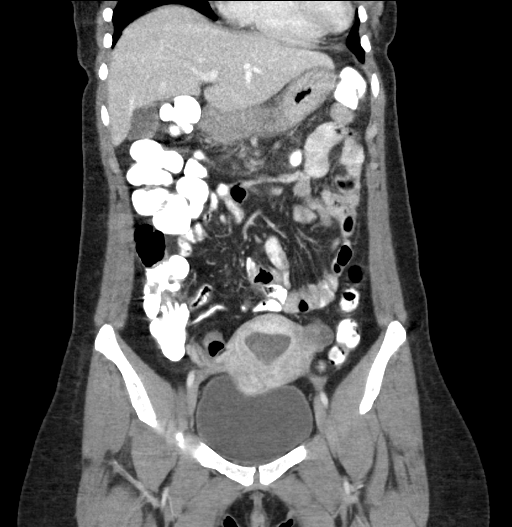
[im 43/97  soft-tissue]
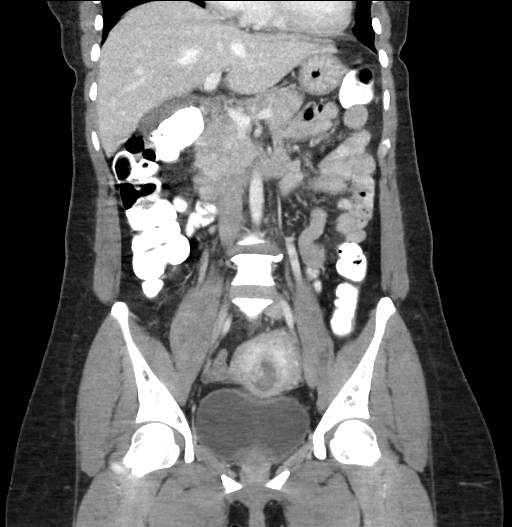
[im 54/97  soft-tissue]
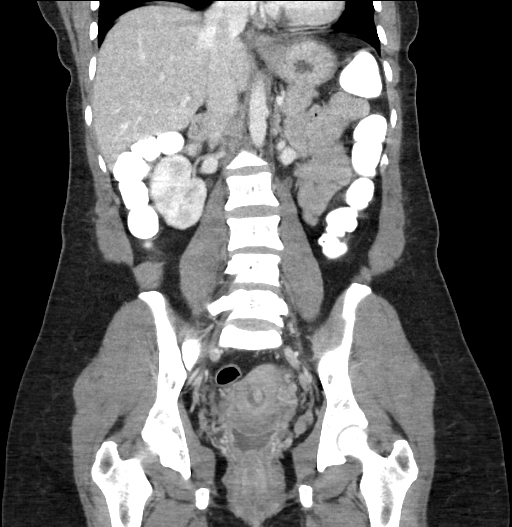

[16 of 46 positions shown; findings below may reference images not displayed]

FINDINGS: Lower chest:  Unremarkable.

Hepatobiliary: Sub cm low-attenuation lesion in segment 7 of the
liver is too small to definitively characterize, but is
statistically likely a tiny cyst. No other larger more suspicious
appearing cystic or solid hepatic lesions are noted. No intra or
extrahepatic biliary ductal dilatation. Gallbladder is normal in
appearance.

Pancreas: No pancreatic mass. No pancreatic ductal dilatation. No
pancreatic or peripancreatic fluid or inflammatory changes.

Spleen: Unremarkable.

Adrenals/Urinary Tract: Sub cm low-attenuation lesion in the lower
pole of the right kidney is too small to definitively characterize,
but is statistically likely a tiny cyst. Left kidney is normal in
appearance. No hydroureteronephrosis. Urinary bladder is normal in
appearance. Bilateral adrenal glands are normal in appearance.

Stomach/Bowel: Normal appearance of the stomach. No pathologic
dilatation of small bowel or colon. Normal appendix.

Vascular/Lymphatic: No significant atherosclerotic disease, aneurysm
or dissection identified in the abdominal or pelvic vasculature. No
lymphadenopathy noted in the abdomen or pelvis.

Reproductive: Sagittal image 84 of series 6 demonstrates what
appears to be a long finger-like polyp which appears to be attached
near the fundal portion of the endometrial canal along the right
posterior wall of the uterus, and appears to be partially prolapsing
out the cervical os. This measures 7.4 x 1.5 x 1.5 cm. There is
fluid in the endometrial canal, likely blood. Additionally, there is
fluid in the vagina. There is also a 2.2 cm heterogeneously
enhancing lesion extending off the anterior wall of the body of the
uterus on the right side, likely to represent a fibroid. Ovaries are
unremarkable in appearance.

Other: No significant volume of ascites.  No pneumoperitoneum.

Musculoskeletal: There are no aggressive appearing lytic or blastic
lesions noted in the visualized portions of the skeleton.
IMPRESSION: 1. Large pedunculated endometrial polyp which extends a length of
approximately 7.4 cm, and is partially prolapsing out the cervical
os. There is fluid in the endometrial canal and vagina, which
appears to be blood. OB-Gyn referral for polypectomy is recommended.
2. 2.2 cm heterogeneously enhancing lesion in the right side of the
uterine body is most likely to represent a small fibroid.
3. No other acute findings in the abdomen or pelvis.
4. Normal appendix.
5. Additional incidental findings, as above.

## 2018-12-21 ENCOUNTER — Encounter: Payer: Self-pay | Admitting: Gynecology

## 2023-06-07 ENCOUNTER — Ambulatory Visit (HOSPITAL_COMMUNITY)
Admission: EM | Admit: 2023-06-07 | Discharge: 2023-06-07 | Disposition: A | Discharge status: Home / Self Care | Attending: Physician Assistant | Admitting: Physician Assistant

## 2023-06-07 ENCOUNTER — Encounter (HOSPITAL_COMMUNITY): Payer: Self-pay

## 2023-06-07 DIAGNOSIS — H5711 Ocular pain, right eye: Secondary | ICD-10-CM | POA: Diagnosis not present

## 2023-06-07 DIAGNOSIS — Z77098 Contact with and (suspected) exposure to other hazardous, chiefly nonmedicinal, chemicals: Secondary | ICD-10-CM

## 2023-06-07 MED ORDER — TETRACAINE HCL 0.5 % OP SOLN
1.0000 [drp] | Freq: Once | OPHTHALMIC | Status: AC
  Administered 2023-06-07: 2 [drp] via OPHTHALMIC

## 2023-06-07 MED ORDER — ERYTHROMYCIN 5 MG/GM OP OINT: TOPICAL_OINTMENT | Freq: Four times a day (QID) | OPHTHALMIC | 0 refills | Status: DC

## 2023-06-07 MED ORDER — TETRACAINE HCL 0.5 % OP SOLN
OPHTHALMIC | Status: AC
  Filled 2023-06-07: qty 4

## 2023-06-07 NOTE — ED Provider Notes (Signed)
 [facility]    CSN: [id number] Arrival date & time: 06/07/23  1640      History   Chief Complaint Chief Complaint  Patient presents with   Eye Pain    HPI Candace Mccormick is a 53 y.o. female.   HPI  [place] interpretor available : Beverely Low [phone]  She states she had isotone splash into her right eye  and now she is having blurry vision  She reports this happened 2 weeks ago  She states she is having a lot of tearing and foreign body sensation  She denies photophobia at this time  She reports she used cold water to flush her eye when this happened   She has tried using eye drops from [place]  She states she is still having foreign body sensation  She denies using contact lenses     Past Medical History:  Diagnosis Date   Anemia    Cervical polyp     Patient Active Problem List   Diagnosis Date Noted   Cephalalgia 09/10/2015   Dizziness 09/10/2015   Rhinitis, allergic 09/10/2015   Status post abdominal hysterectomy 02/11/2015    Past Surgical History:  Procedure Laterality Date   ABDOMINAL HYSTERECTOMY N/A 02/09/2015   Procedure: HYSTERECTOMY ABDOMINAL WITH BILATERAL SALPINGECTOMY;  Surgeon: Ok Edwards, MD;  Location: [facility];  Service: Gynecology;  Laterality: N/A;   CERVICAL POLYPECTOMY     CESAREAN SECTION     x 2 with BTL   DILATATION & CURETTAGE/HYSTEROSCOPY WITH MYOSURE N/A 02/03/2015   Procedure: DILATATION & CURETTAGE/HYSTEROSCOPY WITH MYOSURE;  Surgeon: Ok Edwards, MD;  Location: [facility];  Service: Gynecology;  Laterality: N/A;    OB History     Gravida  4   Para  3   Term  3   Preterm      AB  1   Living  3      SAB  1   IAB      Ectopic      Multiple      Live Births  3            Home Medications    Prior to Admission medications   Medication Sig Start Date End Date Taking? Authorizing Provider  erythromycin ophthalmic ointment Place into the right eye 4 (four) times daily for 7 days. Place  a 1/2 inch ribbon of ointment into the lower eyelid. 06/07/23 06/14/23 Yes Alicen Donalson E, PA-C  cetirizine (ZYRTEC) 10 MG tablet Take 1 tablet (10 mg total) by mouth daily. 09/10/15   Massie Maroon, FNP  ibuprofen (ADVIL,MOTRIN) 200 MG tablet Take 1 tablet (200 mg total) by mouth every 6 (six) hours as needed. 09/10/15   Massie Maroon, FNP  Multiple Vitamin (MULTIVITAMIN) capsule Take 1 capsule by mouth daily. Reported on 09/10/2015    [provider]  Omega-3 Fatty Acids (FISH OIL PO) Take 1 tablet by mouth daily. Reported on 09/10/2015    [provider]    Family History Family History  Problem Relation Age of Onset   Diabetes Mother    Hypertension Father     Social History Social History   Tobacco Use   Smoking status: Never   Smokeless tobacco: Never  Substance Use Topics   Alcohol use: No    Alcohol/week: 0.0 standard drinks of alcohol   Drug use: No     Allergies   Influenza vaccines   Review of Systems Review of Systems  Eyes:  Positive for pain, discharge and visual disturbance.     Physical Exam Triage Vital Signs ED Triage Vitals  Encounter Vitals Group     BP 06/07/23 1731 (!) 102/57     Systolic BP Percentile --      Diastolic BP Percentile --      Pulse Rate 06/07/23 1731 (!) 50     Resp 06/07/23 1731 16     Temp 06/07/23 1731 98.1 F (36.7 C)     Temp Source 06/07/23 1731 Oral     SpO2 06/07/23 1731 98 %     Weight --      Height --      Head Circumference --      Peak Flow --      Pain Score 06/07/23 1732 5     Pain Loc --      Pain Education --      Exclude from Growth Chart --    No data found.  Updated Vital Signs BP (!) 102/57 (BP Location: Left Arm)   Pulse (!) 50   Temp 98.1 F (36.7 C) (Oral)   Resp 16   LMP  (LMP Unknown)   SpO2 98%   Visual Acuity Right Eye Distance:   Left Eye Distance:   Bilateral Distance:    Right Eye Near:   Left Eye Near:    Bilateral Near:     Physical Exam Vitals  reviewed.  Constitutional:      General: She is awake.     Appearance: Normal appearance. She is well-developed and well-groomed.  HENT:     Head: Normocephalic and atraumatic.  Eyes:     General: Lids are normal. Lids are everted, no foreign bodies appreciated. Gaze aligned appropriately. No allergic shiner.       Right eye: No foreign body, discharge or hordeolum.     Extraocular Movements: Extraocular movements intact.     Right eye: Normal extraocular motion and no nystagmus.     Left eye: Normal extraocular motion and no nystagmus.     Conjunctiva/sclera: Conjunctivae normal.     Right eye: Right conjunctiva is not injected. No chemosis, exudate or hemorrhage.    Left eye: Left conjunctiva is not injected. No chemosis, exudate or hemorrhage.    Pupils: Pupils are equal, round, and reactive to light.     Right eye: No corneal abrasion or fluorescein uptake.     Comments: Exam of the eyes reveals intact EOM and no appreciated foreign bodies.  Eyelids were everted and the patient was instructed to complete EOMs again to assess for foreign bodies or clear signs of injury.  No signs of injury or damage. Fluorescein exam performed on the right eye.  No signs of foreign body, corneal abrasion.   Neurological:     Mental Status: She is alert.  Psychiatric:        Behavior: Behavior is cooperative.      UC Treatments / Results  Labs (all labs ordered are listed, but only abnormal results are displayed) Labs Reviewed - No data to display  EKG   Radiology No results found.  Procedures Procedures (including critical care time)  Medications Ordered in UC Medications  tetracaine (PONTOCAINE) 0.5 % ophthalmic solution 1-2 drop (has no administration in time range)    Initial Impression / Assessment and Plan / UC Course  I have reviewed the triage vital signs and the nursing notes.  Pertinent labs & imaging results that were available during my care of  the patient were reviewed  by me and considered in my medical decision making (see chart for details).      Final Clinical Impressions(s) / UC Diagnoses   Final diagnoses:  Pain of right eye  Chemical exposure of eye   Patient presents with concerns for 2 weeks of blurry vision, foreign body sensation to the right eye after acetone was splashed into her eye.  Physical exam did not reveal any notable foreign bodies or obvious injury.  Fluorescein exam did not show any uptake, corneal abrasion or obvious signs of damage.  I discussed with her that I cannot rule out continued chemical injury today and recommend that she follows up with an ophthalmologist in the next week or 2 for further examination and ongoing management.  Recommend starting erythromycin ointment to assist with inflammation and prevent bacterial infection.  Recommend lubricating eyedrops such as blink tears or refresh eyedrops to assist with irritation and foreign body sensation.  Patient voiced understanding and agreement with assessment and recommendations.  ED and return precautions were reviewed and provided in after visit summary.  Follow-up as indicated and discussed.   Discharge Instructions      You were seen today for concerns for chemical exposure to your right eye.  This can cause irritation and continued sensation of a foreign body in the eye.  Usually this does start to resolve with time.  I have sent in a medication called erythromycin.  This is an ointment that I would like you to apply to the eye 4 times per day for the next 7 days.  This is an antibiotic that can help with inflammation and with preventing infection in the eye.  Since you are continuing to have vision changes I do recommend that you follow-up with an eye doctor or an ophthalmologist for further evaluation and ongoing management. You can use lubricating eyedrops such as blink tears or refresh eyedrops to assist with dry eye or irritation. The eye exam that I performed today did  not show any foreign bodies or scratches to the eye but that does not mean that you did not sustain damage when the isotone made contact with your eye.  This is why it is important to follow-up with an eye doctor for further evaluation and management.  If at any point you start to have significant vision changes, redness, swelling, blindness, increased pain or irritation to the eye please go to the emergency room immediately for further evaluation and management.   Hm nay b?n ???c khm v lo ng?i v? vi?c m?t ph?i ti?p xc v?i ha ch?t. ?i?u ny c th? gy kch ?ng v c?m gic lin t?c c d? v?t trong m?t. Thng th??ng, tnh tr?ng ny s? b?t ??u thuyn gi?m theo th?i gian. Ti ? g?i m?t lo?i thu?c g?i l erythromycin. ?y l thu?c m? m ti mu?n b?n bi vo m?t 4 l?n m?i ngy trong 7 ngy t?i. ?y l thu?c khng sinh c th? gip gi?m vim v ng?n ng?a nhi?m trng ? m?t. V b?n v?n ti?p t?c c nh?ng thay ??i v? th? l?c nn ti khuyn b?n nn ??n g?p bc s? nhn khoa ho?c bc s? nhn khoa ?? ?nh gi thm v theo di lin t?c. B?n c th? s? d?ng thu?c nh? m?t bi tr?n nh? n??c m?t ch?p m?t ho?c thu?c nh? m?t Refresh ?? h? tr? tnh tr?ng kh m?t ho?c kch ?ng. K?t qu? khm m?t m ti th?c hi?n hm nay khng cho th?y  b?t k? d? v?t ho?c v?t x??c no ? m?t nh?ng ?i?u ? khng c ngh?a l b?n khng b? t?n th??ng khi ch?t ??ng v? ti?p xc v?i m?t. ?y l l do t?i sao vi?c ??n g?p bc s? nhn khoa ?? ?nh gi thm v theo di l r?t quan tr?ng. N?u t?i b?t k? th?i ?i?m no b?n b?t ??u c nh?ng thay ??i ?ng k? v? th? l?c, ??, s?ng, m, t?ng ?au ho?c kch ?ng m?t, vui lng ??n phng c?p c?u ngay l?p t?c ?? ???c ?nh gi v x? l thm.     ED Prescriptions     Medication Sig Dispense Auth. Provider   erythromycin ophthalmic ointment Place into the right eye 4 (four) times daily for 7 days. Place a 1/2 inch ribbon of ointment into the lower eyelid. 3.5 g Paisli Silfies E, PA-C      PDMP not reviewed this  encounter.   Providence Crosby, PA-C 06/07/23 1920

## 2023-06-07 NOTE — Discharge Instructions (Addendum)
 You were seen today for concerns for chemical exposure to your right eye.  This can cause irritation and continued sensation of a foreign body in the eye.  Usually this does start to resolve with time.  I have sent in a medication called erythromycin.  This is an ointment that I would like you to apply to the eye 4 times per day for the next 7 days.  This is an antibiotic that can help with inflammation and with preventing infection in the eye.  Since you are continuing to have vision changes I do recommend that you follow-up with an eye doctor or an ophthalmologist for further evaluation and ongoing management. You can use lubricating eyedrops such as blink tears or refresh eyedrops to assist with dry eye or irritation. The eye exam that I performed today did not show any foreign bodies or scratches to the eye but that does not mean that you did not sustain damage when the isotone made contact with your eye.  This is why it is important to follow-up with an eye doctor for further evaluation and management.  If at any point you start to have significant vision changes, redness, swelling, blindness, increased pain or irritation to the eye please go to the emergency room immediately for further evaluation and management.   Hm nay b?n ???c khm v lo ng?i v? vi?c m?t ph?i ti?p xc v?i ha ch?t. ?i?u ny c th? gy kch ?ng v c?m gic lin t?c c d? v?t trong m?t. Thng th??ng, tnh tr?ng ny s? b?t ??u thuyn gi?m theo th?i gian. Ti ? g?i m?t lo?i thu?c g?i l erythromycin. ?y l thu?c m? m ti mu?n b?n bi vo m?t 4 l?n m?i ngy trong 7 ngy t?i. ?y l thu?c khng sinh c th? gip gi?m vim v ng?n ng?a nhi?m trng ? m?t. V b?n v?n ti?p t?c c nh?ng thay ??i v? th? l?c nn ti khuyn b?n nn ??n g?p bc s? nhn khoa ho?c bc s? nhn khoa ?? ?nh gi thm v theo di lin t?c. B?n c th? s? d?ng thu?c nh? m?t bi tr?n nh? n??c m?t ch?p m?t ho?c thu?c nh? m?t Refresh ?? h? tr? tnh tr?ng kh m?t ho?c kch  ?ng. K?t qu? khm m?t m ti th?c hi?n hm nay khng cho th?y b?t k? d? v?t ho?c v?t x??c no ? m?t nh?ng ?i?u ? khng c ngh?a l b?n khng b? t?n th??ng khi ch?t ??ng v? ti?p xc v?i m?t. ?y l l do t?i sao vi?c ??n g?p bc s? nhn khoa ?? ?nh gi thm v theo di l r?t quan tr?ng. N?u t?i b?t k? th?i ?i?m no b?n b?t ??u c nh?ng thay ??i ?ng k? v? th? l?c, ??, s?ng, m, t?ng ?au ho?c kch ?ng m?t, vui lng ??n phng c?p c?u ngay l?p t?c ?? ???c ?nh gi v x? l thm.

## 2023-06-07 NOTE — ED Triage Notes (Signed)
 Patient states she got Isotoner in her right eye x 2 weeks. Patient reports some blurred vision.

## 2023-06-11 ENCOUNTER — Telehealth (HOSPITAL_COMMUNITY): Payer: Self-pay

## 2023-06-11 MED ORDER — ERYTHROMYCIN 5 MG/GM OP OINT: TOPICAL_OINTMENT | Freq: Four times a day (QID) | OPHTHALMIC | 0 refills | Status: DC

## 2023-06-11 NOTE — Telephone Encounter (Signed)
 Pharmacy changed per pt request

## 2023-06-14 ENCOUNTER — Telehealth (HOSPITAL_COMMUNITY): Payer: Self-pay

## 2023-06-14 MED ORDER — ERYTHROMYCIN 5 MG/GM OP OINT: TOPICAL_OINTMENT | Freq: Four times a day (QID) | OPHTHALMIC | 0 refills | Status: AC
